# Patient Record
Sex: Female | Born: 1940 | Race: White | Hispanic: No | Marital: Married | Smoking: Never smoker
Health system: Southern US, Community
[De-identification: ages and names within clinical notes are randomized; demographics above are authoritative.]

## PROBLEM LIST (undated history)

## (undated) DIAGNOSIS — J45909 Unspecified asthma, uncomplicated: Secondary | ICD-10-CM

## (undated) DIAGNOSIS — K219 Gastro-esophageal reflux disease without esophagitis: Secondary | ICD-10-CM

## (undated) DIAGNOSIS — E039 Hypothyroidism, unspecified: Secondary | ICD-10-CM

## (undated) DIAGNOSIS — I509 Heart failure, unspecified: Secondary | ICD-10-CM

## (undated) DIAGNOSIS — J449 Chronic obstructive pulmonary disease, unspecified: Secondary | ICD-10-CM

## (undated) DIAGNOSIS — I10 Essential (primary) hypertension: Secondary | ICD-10-CM

## (undated) HISTORY — PX: JOINT REPLACEMENT: SHX530

## (undated) HISTORY — PX: ABDOMINAL HYSTERECTOMY: SHX81

---

## 2017-04-24 DIAGNOSIS — E7800 Pure hypercholesterolemia, unspecified: Secondary | ICD-10-CM | POA: Diagnosis not present

## 2017-04-24 DIAGNOSIS — E039 Hypothyroidism, unspecified: Secondary | ICD-10-CM | POA: Diagnosis not present

## 2017-04-24 DIAGNOSIS — I10 Essential (primary) hypertension: Secondary | ICD-10-CM | POA: Diagnosis not present

## 2017-05-08 DIAGNOSIS — E7800 Pure hypercholesterolemia, unspecified: Secondary | ICD-10-CM | POA: Diagnosis not present

## 2017-05-08 DIAGNOSIS — F329 Major depressive disorder, single episode, unspecified: Secondary | ICD-10-CM | POA: Diagnosis not present

## 2017-05-08 DIAGNOSIS — F411 Generalized anxiety disorder: Secondary | ICD-10-CM | POA: Diagnosis not present

## 2017-05-08 DIAGNOSIS — I10 Essential (primary) hypertension: Secondary | ICD-10-CM | POA: Diagnosis not present

## 2017-06-26 DIAGNOSIS — J302 Other seasonal allergic rhinitis: Secondary | ICD-10-CM | POA: Diagnosis not present

## 2017-06-26 DIAGNOSIS — J40 Bronchitis, not specified as acute or chronic: Secondary | ICD-10-CM | POA: Diagnosis not present

## 2017-06-26 DIAGNOSIS — R0602 Shortness of breath: Secondary | ICD-10-CM | POA: Diagnosis not present

## 2017-06-26 DIAGNOSIS — M159 Polyosteoarthritis, unspecified: Secondary | ICD-10-CM | POA: Diagnosis not present

## 2017-08-14 DIAGNOSIS — I10 Essential (primary) hypertension: Secondary | ICD-10-CM | POA: Diagnosis not present

## 2017-08-14 DIAGNOSIS — E039 Hypothyroidism, unspecified: Secondary | ICD-10-CM | POA: Diagnosis not present

## 2017-08-14 DIAGNOSIS — E7800 Pure hypercholesterolemia, unspecified: Secondary | ICD-10-CM | POA: Diagnosis not present

## 2017-08-21 DIAGNOSIS — E669 Obesity, unspecified: Secondary | ICD-10-CM | POA: Diagnosis not present

## 2017-08-21 DIAGNOSIS — E039 Hypothyroidism, unspecified: Secondary | ICD-10-CM | POA: Diagnosis not present

## 2017-08-21 DIAGNOSIS — E7800 Pure hypercholesterolemia, unspecified: Secondary | ICD-10-CM | POA: Diagnosis not present

## 2017-08-21 DIAGNOSIS — Z6831 Body mass index (BMI) 31.0-31.9, adult: Secondary | ICD-10-CM | POA: Diagnosis not present

## 2017-08-21 DIAGNOSIS — I10 Essential (primary) hypertension: Secondary | ICD-10-CM | POA: Diagnosis not present

## 2017-08-21 DIAGNOSIS — K219 Gastro-esophageal reflux disease without esophagitis: Secondary | ICD-10-CM | POA: Diagnosis not present

## 2017-08-21 DIAGNOSIS — Z0000 Encounter for general adult medical examination without abnormal findings: Secondary | ICD-10-CM | POA: Diagnosis not present

## 2017-11-20 DIAGNOSIS — E039 Hypothyroidism, unspecified: Secondary | ICD-10-CM | POA: Diagnosis not present

## 2017-11-20 DIAGNOSIS — I10 Essential (primary) hypertension: Secondary | ICD-10-CM | POA: Diagnosis not present

## 2017-11-20 DIAGNOSIS — E7800 Pure hypercholesterolemia, unspecified: Secondary | ICD-10-CM | POA: Diagnosis not present

## 2017-11-27 DIAGNOSIS — K219 Gastro-esophageal reflux disease without esophagitis: Secondary | ICD-10-CM | POA: Diagnosis not present

## 2017-11-27 DIAGNOSIS — K580 Irritable bowel syndrome with diarrhea: Secondary | ICD-10-CM | POA: Diagnosis not present

## 2017-11-27 DIAGNOSIS — E669 Obesity, unspecified: Secondary | ICD-10-CM | POA: Diagnosis not present

## 2017-11-27 DIAGNOSIS — E7800 Pure hypercholesterolemia, unspecified: Secondary | ICD-10-CM | POA: Diagnosis not present

## 2017-11-27 DIAGNOSIS — F329 Major depressive disorder, single episode, unspecified: Secondary | ICD-10-CM | POA: Diagnosis not present

## 2017-11-27 DIAGNOSIS — F5104 Psychophysiologic insomnia: Secondary | ICD-10-CM | POA: Diagnosis not present

## 2017-11-27 DIAGNOSIS — G2581 Restless legs syndrome: Secondary | ICD-10-CM | POA: Diagnosis not present

## 2017-11-27 DIAGNOSIS — M159 Polyosteoarthritis, unspecified: Secondary | ICD-10-CM | POA: Diagnosis not present

## 2017-11-27 DIAGNOSIS — E039 Hypothyroidism, unspecified: Secondary | ICD-10-CM | POA: Diagnosis not present

## 2017-11-27 DIAGNOSIS — I10 Essential (primary) hypertension: Secondary | ICD-10-CM | POA: Diagnosis not present

## 2017-11-27 DIAGNOSIS — F411 Generalized anxiety disorder: Secondary | ICD-10-CM | POA: Diagnosis not present

## 2017-11-27 DIAGNOSIS — J302 Other seasonal allergic rhinitis: Secondary | ICD-10-CM | POA: Diagnosis not present

## 2018-01-05 DIAGNOSIS — Z23 Encounter for immunization: Secondary | ICD-10-CM | POA: Diagnosis not present

## 2018-03-05 DIAGNOSIS — E7800 Pure hypercholesterolemia, unspecified: Secondary | ICD-10-CM | POA: Diagnosis not present

## 2018-03-05 DIAGNOSIS — I10 Essential (primary) hypertension: Secondary | ICD-10-CM | POA: Diagnosis not present

## 2018-03-05 DIAGNOSIS — E039 Hypothyroidism, unspecified: Secondary | ICD-10-CM | POA: Diagnosis not present

## 2018-03-10 DIAGNOSIS — J302 Other seasonal allergic rhinitis: Secondary | ICD-10-CM | POA: Diagnosis not present

## 2018-03-10 DIAGNOSIS — G2581 Restless legs syndrome: Secondary | ICD-10-CM | POA: Diagnosis not present

## 2018-03-10 DIAGNOSIS — K580 Irritable bowel syndrome with diarrhea: Secondary | ICD-10-CM | POA: Diagnosis not present

## 2018-03-10 DIAGNOSIS — E039 Hypothyroidism, unspecified: Secondary | ICD-10-CM | POA: Diagnosis not present

## 2018-03-10 DIAGNOSIS — Z1321 Encounter for screening for nutritional disorder: Secondary | ICD-10-CM | POA: Diagnosis not present

## 2018-03-10 DIAGNOSIS — K219 Gastro-esophageal reflux disease without esophagitis: Secondary | ICD-10-CM | POA: Diagnosis not present

## 2018-03-10 DIAGNOSIS — F411 Generalized anxiety disorder: Secondary | ICD-10-CM | POA: Diagnosis not present

## 2018-03-10 DIAGNOSIS — M159 Polyosteoarthritis, unspecified: Secondary | ICD-10-CM | POA: Diagnosis not present

## 2018-03-10 DIAGNOSIS — E7800 Pure hypercholesterolemia, unspecified: Secondary | ICD-10-CM | POA: Diagnosis not present

## 2018-03-10 DIAGNOSIS — I10 Essential (primary) hypertension: Secondary | ICD-10-CM | POA: Diagnosis not present

## 2018-03-10 DIAGNOSIS — Z1211 Encounter for screening for malignant neoplasm of colon: Secondary | ICD-10-CM | POA: Diagnosis not present

## 2018-03-10 DIAGNOSIS — Z0000 Encounter for general adult medical examination without abnormal findings: Secondary | ICD-10-CM | POA: Diagnosis not present

## 2018-04-29 DIAGNOSIS — K639 Disease of intestine, unspecified: Secondary | ICD-10-CM | POA: Diagnosis not present

## 2018-04-29 DIAGNOSIS — R197 Diarrhea, unspecified: Secondary | ICD-10-CM | POA: Diagnosis not present

## 2018-04-29 DIAGNOSIS — Z1211 Encounter for screening for malignant neoplasm of colon: Secondary | ICD-10-CM | POA: Diagnosis not present

## 2018-04-29 DIAGNOSIS — K648 Other hemorrhoids: Secondary | ICD-10-CM | POA: Diagnosis not present

## 2018-04-29 DIAGNOSIS — Z86010 Personal history of colonic polyps: Secondary | ICD-10-CM | POA: Diagnosis not present

## 2018-05-30 DIAGNOSIS — J019 Acute sinusitis, unspecified: Secondary | ICD-10-CM | POA: Diagnosis not present

## 2018-06-04 DIAGNOSIS — E039 Hypothyroidism, unspecified: Secondary | ICD-10-CM | POA: Diagnosis not present

## 2018-06-04 DIAGNOSIS — I10 Essential (primary) hypertension: Secondary | ICD-10-CM | POA: Diagnosis not present

## 2018-06-04 DIAGNOSIS — Z1321 Encounter for screening for nutritional disorder: Secondary | ICD-10-CM | POA: Diagnosis not present

## 2018-06-04 DIAGNOSIS — E7800 Pure hypercholesterolemia, unspecified: Secondary | ICD-10-CM | POA: Diagnosis not present

## 2018-06-11 DIAGNOSIS — I7300 Raynaud's syndrome without gangrene: Secondary | ICD-10-CM | POA: Diagnosis not present

## 2018-06-11 DIAGNOSIS — I10 Essential (primary) hypertension: Secondary | ICD-10-CM | POA: Diagnosis not present

## 2018-06-11 DIAGNOSIS — K219 Gastro-esophageal reflux disease without esophagitis: Secondary | ICD-10-CM | POA: Diagnosis not present

## 2018-06-11 DIAGNOSIS — G2581 Restless legs syndrome: Secondary | ICD-10-CM | POA: Diagnosis not present

## 2018-06-11 DIAGNOSIS — M159 Polyosteoarthritis, unspecified: Secondary | ICD-10-CM | POA: Diagnosis not present

## 2018-06-11 DIAGNOSIS — J302 Other seasonal allergic rhinitis: Secondary | ICD-10-CM | POA: Diagnosis not present

## 2018-06-11 DIAGNOSIS — E039 Hypothyroidism, unspecified: Secondary | ICD-10-CM | POA: Diagnosis not present

## 2018-06-11 DIAGNOSIS — E7800 Pure hypercholesterolemia, unspecified: Secondary | ICD-10-CM | POA: Diagnosis not present

## 2018-06-11 DIAGNOSIS — F5104 Psychophysiologic insomnia: Secondary | ICD-10-CM | POA: Diagnosis not present

## 2018-06-11 DIAGNOSIS — F411 Generalized anxiety disorder: Secondary | ICD-10-CM | POA: Diagnosis not present

## 2018-06-11 DIAGNOSIS — F329 Major depressive disorder, single episode, unspecified: Secondary | ICD-10-CM | POA: Diagnosis not present

## 2018-06-11 DIAGNOSIS — E669 Obesity, unspecified: Secondary | ICD-10-CM | POA: Diagnosis not present

## 2018-07-16 DIAGNOSIS — J302 Other seasonal allergic rhinitis: Secondary | ICD-10-CM | POA: Diagnosis not present

## 2018-08-23 DIAGNOSIS — L821 Other seborrheic keratosis: Secondary | ICD-10-CM | POA: Diagnosis not present

## 2018-08-23 DIAGNOSIS — L570 Actinic keratosis: Secondary | ICD-10-CM | POA: Diagnosis not present

## 2018-09-10 DIAGNOSIS — E7800 Pure hypercholesterolemia, unspecified: Secondary | ICD-10-CM | POA: Diagnosis not present

## 2018-09-10 DIAGNOSIS — I10 Essential (primary) hypertension: Secondary | ICD-10-CM | POA: Diagnosis not present

## 2018-09-10 DIAGNOSIS — E559 Vitamin D deficiency, unspecified: Secondary | ICD-10-CM | POA: Diagnosis not present

## 2018-09-17 DIAGNOSIS — Z1159 Encounter for screening for other viral diseases: Secondary | ICD-10-CM | POA: Diagnosis not present

## 2018-09-17 DIAGNOSIS — M159 Polyosteoarthritis, unspecified: Secondary | ICD-10-CM | POA: Diagnosis not present

## 2018-09-17 DIAGNOSIS — G2581 Restless legs syndrome: Secondary | ICD-10-CM | POA: Diagnosis not present

## 2018-09-17 DIAGNOSIS — K580 Irritable bowel syndrome with diarrhea: Secondary | ICD-10-CM | POA: Diagnosis not present

## 2018-09-17 DIAGNOSIS — E7800 Pure hypercholesterolemia, unspecified: Secondary | ICD-10-CM | POA: Diagnosis not present

## 2018-09-17 DIAGNOSIS — E039 Hypothyroidism, unspecified: Secondary | ICD-10-CM | POA: Diagnosis not present

## 2018-09-17 DIAGNOSIS — K219 Gastro-esophageal reflux disease without esophagitis: Secondary | ICD-10-CM | POA: Diagnosis not present

## 2018-09-17 DIAGNOSIS — I7300 Raynaud's syndrome without gangrene: Secondary | ICD-10-CM | POA: Diagnosis not present

## 2018-09-17 DIAGNOSIS — F5104 Psychophysiologic insomnia: Secondary | ICD-10-CM | POA: Diagnosis not present

## 2018-09-17 DIAGNOSIS — F411 Generalized anxiety disorder: Secondary | ICD-10-CM | POA: Diagnosis not present

## 2018-09-17 DIAGNOSIS — E669 Obesity, unspecified: Secondary | ICD-10-CM | POA: Diagnosis not present

## 2018-09-17 DIAGNOSIS — F329 Major depressive disorder, single episode, unspecified: Secondary | ICD-10-CM | POA: Diagnosis not present

## 2018-09-17 DIAGNOSIS — Z0000 Encounter for general adult medical examination without abnormal findings: Secondary | ICD-10-CM | POA: Diagnosis not present

## 2018-09-17 DIAGNOSIS — I10 Essential (primary) hypertension: Secondary | ICD-10-CM | POA: Diagnosis not present

## 2018-10-29 DIAGNOSIS — R05 Cough: Secondary | ICD-10-CM | POA: Diagnosis not present

## 2018-11-13 DIAGNOSIS — Z20828 Contact with and (suspected) exposure to other viral communicable diseases: Secondary | ICD-10-CM | POA: Diagnosis not present

## 2018-11-22 DIAGNOSIS — I8311 Varicose veins of right lower extremity with inflammation: Secondary | ICD-10-CM | POA: Diagnosis not present

## 2018-11-22 DIAGNOSIS — I8001 Phlebitis and thrombophlebitis of superficial vessels of right lower extremity: Secondary | ICD-10-CM | POA: Diagnosis not present

## 2018-11-22 DIAGNOSIS — I8312 Varicose veins of left lower extremity with inflammation: Secondary | ICD-10-CM | POA: Diagnosis not present

## 2018-11-24 DIAGNOSIS — I8312 Varicose veins of left lower extremity with inflammation: Secondary | ICD-10-CM | POA: Diagnosis not present

## 2018-11-24 DIAGNOSIS — I8311 Varicose veins of right lower extremity with inflammation: Secondary | ICD-10-CM | POA: Diagnosis not present

## 2018-11-28 DIAGNOSIS — R05 Cough: Secondary | ICD-10-CM | POA: Diagnosis not present

## 2018-11-28 DIAGNOSIS — U071 COVID-19: Secondary | ICD-10-CM | POA: Diagnosis not present

## 2018-11-28 DIAGNOSIS — R0602 Shortness of breath: Secondary | ICD-10-CM | POA: Diagnosis not present

## 2018-11-29 DIAGNOSIS — I83813 Varicose veins of bilateral lower extremities with pain: Secondary | ICD-10-CM | POA: Diagnosis not present

## 2018-11-29 DIAGNOSIS — R0602 Shortness of breath: Secondary | ICD-10-CM | POA: Diagnosis not present

## 2018-11-29 DIAGNOSIS — I8001 Phlebitis and thrombophlebitis of superficial vessels of right lower extremity: Secondary | ICD-10-CM | POA: Diagnosis not present

## 2018-11-29 DIAGNOSIS — I8312 Varicose veins of left lower extremity with inflammation: Secondary | ICD-10-CM | POA: Diagnosis not present

## 2018-11-29 DIAGNOSIS — R51 Headache: Secondary | ICD-10-CM | POA: Diagnosis not present

## 2018-11-29 DIAGNOSIS — Z20828 Contact with and (suspected) exposure to other viral communicable diseases: Secondary | ICD-10-CM | POA: Diagnosis not present

## 2018-11-29 DIAGNOSIS — I8311 Varicose veins of right lower extremity with inflammation: Secondary | ICD-10-CM | POA: Diagnosis not present

## 2018-12-08 DIAGNOSIS — R062 Wheezing: Secondary | ICD-10-CM | POA: Diagnosis not present

## 2018-12-08 DIAGNOSIS — R05 Cough: Secondary | ICD-10-CM | POA: Diagnosis not present

## 2018-12-08 DIAGNOSIS — R0602 Shortness of breath: Secondary | ICD-10-CM | POA: Diagnosis not present

## 2018-12-08 DIAGNOSIS — J209 Acute bronchitis, unspecified: Secondary | ICD-10-CM | POA: Diagnosis not present

## 2018-12-09 DIAGNOSIS — R062 Wheezing: Secondary | ICD-10-CM | POA: Diagnosis not present

## 2018-12-09 DIAGNOSIS — J209 Acute bronchitis, unspecified: Secondary | ICD-10-CM | POA: Diagnosis not present

## 2018-12-10 DIAGNOSIS — E669 Obesity, unspecified: Secondary | ICD-10-CM | POA: Diagnosis not present

## 2018-12-10 DIAGNOSIS — I429 Cardiomyopathy, unspecified: Secondary | ICD-10-CM | POA: Diagnosis not present

## 2018-12-10 DIAGNOSIS — E7800 Pure hypercholesterolemia, unspecified: Secondary | ICD-10-CM | POA: Diagnosis not present

## 2018-12-10 DIAGNOSIS — I080 Rheumatic disorders of both mitral and aortic valves: Secondary | ICD-10-CM | POA: Diagnosis not present

## 2018-12-10 DIAGNOSIS — F418 Other specified anxiety disorders: Secondary | ICD-10-CM | POA: Diagnosis not present

## 2018-12-10 DIAGNOSIS — I498 Other specified cardiac arrhythmias: Secondary | ICD-10-CM | POA: Diagnosis not present

## 2018-12-10 DIAGNOSIS — G2581 Restless legs syndrome: Secondary | ICD-10-CM | POA: Diagnosis not present

## 2018-12-10 DIAGNOSIS — U071 COVID-19: Secondary | ICD-10-CM | POA: Diagnosis not present

## 2018-12-10 DIAGNOSIS — K580 Irritable bowel syndrome with diarrhea: Secondary | ICD-10-CM | POA: Diagnosis not present

## 2018-12-10 DIAGNOSIS — T17590A Other foreign object in bronchus causing asphyxiation, initial encounter: Secondary | ICD-10-CM | POA: Diagnosis not present

## 2018-12-10 DIAGNOSIS — R402112 Coma scale, eyes open, never, at arrival to emergency department: Secondary | ICD-10-CM | POA: Diagnosis not present

## 2018-12-10 DIAGNOSIS — K828 Other specified diseases of gallbladder: Secondary | ICD-10-CM | POA: Diagnosis not present

## 2018-12-10 DIAGNOSIS — I083 Combined rheumatic disorders of mitral, aortic and tricuspid valves: Secondary | ICD-10-CM | POA: Diagnosis not present

## 2018-12-10 DIAGNOSIS — R402431 Glasgow coma scale score 3-8, in the field [EMT or ambulance]: Secondary | ICD-10-CM | POA: Diagnosis not present

## 2018-12-10 DIAGNOSIS — A419 Sepsis, unspecified organism: Secondary | ICD-10-CM | POA: Diagnosis not present

## 2018-12-10 DIAGNOSIS — I252 Old myocardial infarction: Secondary | ICD-10-CM | POA: Diagnosis not present

## 2018-12-10 DIAGNOSIS — Z781 Physical restraint status: Secondary | ICD-10-CM | POA: Diagnosis not present

## 2018-12-10 DIAGNOSIS — J9602 Acute respiratory failure with hypercapnia: Secondary | ICD-10-CM | POA: Diagnosis not present

## 2018-12-10 DIAGNOSIS — B3324 Viral cardiomyopathy: Secondary | ICD-10-CM | POA: Diagnosis not present

## 2018-12-10 DIAGNOSIS — Z452 Encounter for adjustment and management of vascular access device: Secondary | ICD-10-CM | POA: Diagnosis not present

## 2018-12-10 DIAGNOSIS — R402212 Coma scale, best verbal response, none, at arrival to emergency department: Secondary | ICD-10-CM | POA: Diagnosis not present

## 2018-12-10 DIAGNOSIS — R404 Transient alteration of awareness: Secondary | ICD-10-CM | POA: Diagnosis not present

## 2018-12-10 DIAGNOSIS — R55 Syncope and collapse: Secondary | ICD-10-CM | POA: Diagnosis not present

## 2018-12-10 DIAGNOSIS — R188 Other ascites: Secondary | ICD-10-CM | POA: Diagnosis not present

## 2018-12-10 DIAGNOSIS — F329 Major depressive disorder, single episode, unspecified: Secondary | ICD-10-CM | POA: Diagnosis not present

## 2018-12-10 DIAGNOSIS — Z6834 Body mass index (BMI) 34.0-34.9, adult: Secondary | ICD-10-CM | POA: Diagnosis not present

## 2018-12-10 DIAGNOSIS — N179 Acute kidney failure, unspecified: Secondary | ICD-10-CM | POA: Diagnosis not present

## 2018-12-10 DIAGNOSIS — I4510 Unspecified right bundle-branch block: Secondary | ICD-10-CM | POA: Diagnosis not present

## 2018-12-10 DIAGNOSIS — J80 Acute respiratory distress syndrome: Secondary | ICD-10-CM | POA: Diagnosis not present

## 2018-12-10 DIAGNOSIS — I428 Other cardiomyopathies: Secondary | ICD-10-CM | POA: Diagnosis not present

## 2018-12-10 DIAGNOSIS — J449 Chronic obstructive pulmonary disease, unspecified: Secondary | ICD-10-CM | POA: Diagnosis not present

## 2018-12-10 DIAGNOSIS — I700 Atherosclerosis of aorta: Secondary | ICD-10-CM | POA: Diagnosis not present

## 2018-12-10 DIAGNOSIS — E039 Hypothyroidism, unspecified: Secondary | ICD-10-CM | POA: Diagnosis not present

## 2018-12-10 DIAGNOSIS — K760 Fatty (change of) liver, not elsewhere classified: Secondary | ICD-10-CM | POA: Diagnosis not present

## 2018-12-10 DIAGNOSIS — K219 Gastro-esophageal reflux disease without esophagitis: Secondary | ICD-10-CM | POA: Diagnosis not present

## 2018-12-10 DIAGNOSIS — R402312 Coma scale, best motor response, none, at arrival to emergency department: Secondary | ICD-10-CM | POA: Diagnosis not present

## 2018-12-10 DIAGNOSIS — J441 Chronic obstructive pulmonary disease with (acute) exacerbation: Secondary | ICD-10-CM | POA: Diagnosis not present

## 2018-12-10 DIAGNOSIS — R000 Tachycardia, unspecified: Secondary | ICD-10-CM | POA: Diagnosis not present

## 2018-12-10 DIAGNOSIS — R6521 Severe sepsis with septic shock: Secondary | ICD-10-CM | POA: Diagnosis not present

## 2018-12-10 DIAGNOSIS — I110 Hypertensive heart disease with heart failure: Secondary | ICD-10-CM | POA: Diagnosis not present

## 2018-12-10 DIAGNOSIS — J9691 Respiratory failure, unspecified with hypoxia: Secondary | ICD-10-CM | POA: Diagnosis not present

## 2018-12-10 DIAGNOSIS — E872 Acidosis: Secondary | ICD-10-CM | POA: Diagnosis not present

## 2018-12-10 DIAGNOSIS — R069 Unspecified abnormalities of breathing: Secondary | ICD-10-CM | POA: Diagnosis not present

## 2018-12-10 DIAGNOSIS — R4020 Unspecified coma: Secondary | ICD-10-CM | POA: Diagnosis not present

## 2018-12-10 DIAGNOSIS — J9601 Acute respiratory failure with hypoxia: Secondary | ICD-10-CM | POA: Diagnosis not present

## 2018-12-10 DIAGNOSIS — J440 Chronic obstructive pulmonary disease with acute lower respiratory infection: Secondary | ICD-10-CM | POA: Diagnosis not present

## 2018-12-10 DIAGNOSIS — R0602 Shortness of breath: Secondary | ICD-10-CM | POA: Diagnosis not present

## 2018-12-10 DIAGNOSIS — A4189 Other specified sepsis: Secondary | ICD-10-CM | POA: Diagnosis not present

## 2018-12-10 DIAGNOSIS — J9600 Acute respiratory failure, unspecified whether with hypoxia or hypercapnia: Secondary | ICD-10-CM | POA: Diagnosis not present

## 2018-12-10 DIAGNOSIS — J1289 Other viral pneumonia: Secondary | ICD-10-CM | POA: Diagnosis not present

## 2018-12-10 DIAGNOSIS — R0902 Hypoxemia: Secondary | ICD-10-CM | POA: Diagnosis not present

## 2018-12-10 DIAGNOSIS — I5021 Acute systolic (congestive) heart failure: Secondary | ICD-10-CM | POA: Diagnosis not present

## 2018-12-10 DIAGNOSIS — I493 Ventricular premature depolarization: Secondary | ICD-10-CM | POA: Diagnosis not present

## 2018-12-10 DIAGNOSIS — R0603 Acute respiratory distress: Secondary | ICD-10-CM | POA: Diagnosis not present

## 2018-12-10 DIAGNOSIS — I214 Non-ST elevation (NSTEMI) myocardial infarction: Secondary | ICD-10-CM | POA: Diagnosis not present

## 2018-12-10 DIAGNOSIS — I10 Essential (primary) hypertension: Secondary | ICD-10-CM | POA: Diagnosis not present

## 2018-12-18 DIAGNOSIS — J9602 Acute respiratory failure with hypercapnia: Secondary | ICD-10-CM | POA: Diagnosis not present

## 2018-12-18 DIAGNOSIS — I5022 Chronic systolic (congestive) heart failure: Secondary | ICD-10-CM | POA: Diagnosis not present

## 2018-12-18 DIAGNOSIS — Z833 Family history of diabetes mellitus: Secondary | ICD-10-CM | POA: Diagnosis not present

## 2018-12-18 DIAGNOSIS — J9601 Acute respiratory failure with hypoxia: Secondary | ICD-10-CM | POA: Diagnosis not present

## 2018-12-18 DIAGNOSIS — G9341 Metabolic encephalopathy: Secondary | ICD-10-CM | POA: Diagnosis not present

## 2018-12-18 DIAGNOSIS — F418 Other specified anxiety disorders: Secondary | ICD-10-CM | POA: Diagnosis not present

## 2018-12-18 DIAGNOSIS — R9431 Abnormal electrocardiogram [ECG] [EKG]: Secondary | ICD-10-CM | POA: Diagnosis not present

## 2018-12-18 DIAGNOSIS — Z90710 Acquired absence of both cervix and uterus: Secondary | ICD-10-CM | POA: Diagnosis not present

## 2018-12-18 DIAGNOSIS — Z608 Other problems related to social environment: Secondary | ICD-10-CM | POA: Diagnosis not present

## 2018-12-18 DIAGNOSIS — K219 Gastro-esophageal reflux disease without esophagitis: Secondary | ICD-10-CM | POA: Diagnosis not present

## 2018-12-18 DIAGNOSIS — M1990 Unspecified osteoarthritis, unspecified site: Secondary | ICD-10-CM | POA: Diagnosis not present

## 2018-12-18 DIAGNOSIS — R069 Unspecified abnormalities of breathing: Secondary | ICD-10-CM | POA: Diagnosis not present

## 2018-12-18 DIAGNOSIS — J9621 Acute and chronic respiratory failure with hypoxia: Secondary | ICD-10-CM | POA: Diagnosis not present

## 2018-12-18 DIAGNOSIS — U071 COVID-19: Secondary | ICD-10-CM | POA: Diagnosis not present

## 2018-12-18 DIAGNOSIS — R0602 Shortness of breath: Secondary | ICD-10-CM | POA: Diagnosis not present

## 2018-12-18 DIAGNOSIS — T402X5A Adverse effect of other opioids, initial encounter: Secondary | ICD-10-CM | POA: Diagnosis not present

## 2018-12-18 DIAGNOSIS — K760 Fatty (change of) liver, not elsewhere classified: Secondary | ICD-10-CM | POA: Diagnosis not present

## 2018-12-18 DIAGNOSIS — R918 Other nonspecific abnormal finding of lung field: Secondary | ICD-10-CM | POA: Diagnosis not present

## 2018-12-18 DIAGNOSIS — R404 Transient alteration of awareness: Secondary | ICD-10-CM | POA: Diagnosis not present

## 2018-12-18 DIAGNOSIS — R0689 Other abnormalities of breathing: Secondary | ICD-10-CM | POA: Diagnosis not present

## 2018-12-18 DIAGNOSIS — E039 Hypothyroidism, unspecified: Secondary | ICD-10-CM | POA: Diagnosis not present

## 2018-12-18 DIAGNOSIS — J811 Chronic pulmonary edema: Secondary | ICD-10-CM | POA: Diagnosis not present

## 2018-12-18 DIAGNOSIS — J9692 Respiratory failure, unspecified with hypercapnia: Secondary | ICD-10-CM | POA: Diagnosis not present

## 2018-12-18 DIAGNOSIS — E785 Hyperlipidemia, unspecified: Secondary | ICD-10-CM | POA: Diagnosis not present

## 2018-12-18 DIAGNOSIS — K580 Irritable bowel syndrome with diarrhea: Secondary | ICD-10-CM | POA: Diagnosis not present

## 2018-12-18 DIAGNOSIS — K7581 Nonalcoholic steatohepatitis (NASH): Secondary | ICD-10-CM | POA: Diagnosis not present

## 2018-12-18 DIAGNOSIS — E860 Dehydration: Secondary | ICD-10-CM | POA: Diagnosis not present

## 2018-12-18 DIAGNOSIS — R0989 Other specified symptoms and signs involving the circulatory and respiratory systems: Secondary | ICD-10-CM | POA: Diagnosis not present

## 2018-12-18 DIAGNOSIS — Z9981 Dependence on supplemental oxygen: Secondary | ICD-10-CM | POA: Diagnosis not present

## 2018-12-18 DIAGNOSIS — I10 Essential (primary) hypertension: Secondary | ICD-10-CM | POA: Diagnosis not present

## 2018-12-18 DIAGNOSIS — R197 Diarrhea, unspecified: Secondary | ICD-10-CM | POA: Diagnosis not present

## 2018-12-18 DIAGNOSIS — G4730 Sleep apnea, unspecified: Secondary | ICD-10-CM | POA: Diagnosis not present

## 2018-12-18 DIAGNOSIS — J440 Chronic obstructive pulmonary disease with acute lower respiratory infection: Secondary | ICD-10-CM | POA: Diagnosis not present

## 2018-12-18 DIAGNOSIS — R0902 Hypoxemia: Secondary | ICD-10-CM | POA: Diagnosis not present

## 2018-12-18 DIAGNOSIS — Z825 Family history of asthma and other chronic lower respiratory diseases: Secondary | ICD-10-CM | POA: Diagnosis not present

## 2018-12-18 DIAGNOSIS — C7400 Malignant neoplasm of cortex of unspecified adrenal gland: Secondary | ICD-10-CM | POA: Diagnosis not present

## 2018-12-18 DIAGNOSIS — G4733 Obstructive sleep apnea (adult) (pediatric): Secondary | ICD-10-CM | POA: Diagnosis not present

## 2018-12-18 DIAGNOSIS — E669 Obesity, unspecified: Secondary | ICD-10-CM | POA: Diagnosis not present

## 2018-12-18 DIAGNOSIS — J1289 Other viral pneumonia: Secondary | ICD-10-CM | POA: Diagnosis not present

## 2018-12-18 DIAGNOSIS — J9622 Acute and chronic respiratory failure with hypercapnia: Secondary | ICD-10-CM | POA: Diagnosis not present

## 2018-12-18 DIAGNOSIS — G4700 Insomnia, unspecified: Secondary | ICD-10-CM | POA: Diagnosis not present

## 2018-12-18 DIAGNOSIS — Z79899 Other long term (current) drug therapy: Secondary | ICD-10-CM | POA: Diagnosis not present

## 2018-12-18 DIAGNOSIS — Z6837 Body mass index (BMI) 37.0-37.9, adult: Secondary | ICD-10-CM | POA: Diagnosis not present

## 2018-12-18 DIAGNOSIS — I110 Hypertensive heart disease with heart failure: Secondary | ICD-10-CM | POA: Diagnosis not present

## 2018-12-18 DIAGNOSIS — J449 Chronic obstructive pulmonary disease, unspecified: Secondary | ICD-10-CM | POA: Diagnosis not present

## 2018-12-27 DIAGNOSIS — U071 COVID-19: Secondary | ICD-10-CM | POA: Diagnosis not present

## 2018-12-27 DIAGNOSIS — R918 Other nonspecific abnormal finding of lung field: Secondary | ICD-10-CM | POA: Diagnosis not present

## 2018-12-27 DIAGNOSIS — J189 Pneumonia, unspecified organism: Secondary | ICD-10-CM | POA: Diagnosis not present

## 2018-12-27 DIAGNOSIS — Z23 Encounter for immunization: Secondary | ICD-10-CM | POA: Diagnosis not present

## 2018-12-27 DIAGNOSIS — G4733 Obstructive sleep apnea (adult) (pediatric): Secondary | ICD-10-CM | POA: Diagnosis not present

## 2018-12-27 DIAGNOSIS — J440 Chronic obstructive pulmonary disease with acute lower respiratory infection: Secondary | ICD-10-CM | POA: Diagnosis not present

## 2018-12-27 DIAGNOSIS — J9622 Acute and chronic respiratory failure with hypercapnia: Secondary | ICD-10-CM | POA: Diagnosis not present

## 2018-12-27 DIAGNOSIS — J9621 Acute and chronic respiratory failure with hypoxia: Secondary | ICD-10-CM | POA: Diagnosis not present

## 2018-12-28 DIAGNOSIS — E7800 Pure hypercholesterolemia, unspecified: Secondary | ICD-10-CM | POA: Diagnosis not present

## 2018-12-28 DIAGNOSIS — I429 Cardiomyopathy, unspecified: Secondary | ICD-10-CM | POA: Diagnosis not present

## 2018-12-28 DIAGNOSIS — I10 Essential (primary) hypertension: Secondary | ICD-10-CM | POA: Diagnosis not present

## 2018-12-28 DIAGNOSIS — I214 Non-ST elevation (NSTEMI) myocardial infarction: Secondary | ICD-10-CM | POA: Diagnosis not present

## 2018-12-29 DIAGNOSIS — R918 Other nonspecific abnormal finding of lung field: Secondary | ICD-10-CM | POA: Diagnosis not present

## 2018-12-29 DIAGNOSIS — J440 Chronic obstructive pulmonary disease with acute lower respiratory infection: Secondary | ICD-10-CM | POA: Diagnosis not present

## 2018-12-29 DIAGNOSIS — J189 Pneumonia, unspecified organism: Secondary | ICD-10-CM | POA: Diagnosis not present

## 2018-12-29 DIAGNOSIS — U071 COVID-19: Secondary | ICD-10-CM | POA: Diagnosis not present

## 2018-12-29 DIAGNOSIS — J9622 Acute and chronic respiratory failure with hypercapnia: Secondary | ICD-10-CM | POA: Diagnosis not present

## 2018-12-29 DIAGNOSIS — J9621 Acute and chronic respiratory failure with hypoxia: Secondary | ICD-10-CM | POA: Diagnosis not present

## 2018-12-29 DIAGNOSIS — Z23 Encounter for immunization: Secondary | ICD-10-CM | POA: Diagnosis not present

## 2018-12-29 DIAGNOSIS — G4733 Obstructive sleep apnea (adult) (pediatric): Secondary | ICD-10-CM | POA: Diagnosis not present

## 2019-01-08 ENCOUNTER — Inpatient Hospital Stay (HOSPITAL_COMMUNITY)
Admission: EM | Admit: 2019-01-08 | Discharge: 2019-01-10 | DRG: 177 | Disposition: A | Discharge status: Home / Self Care | Payer: PPO | Attending: Internal Medicine | Admitting: Internal Medicine

## 2019-01-08 ENCOUNTER — Emergency Department (HOSPITAL_COMMUNITY): Payer: PPO

## 2019-01-08 ENCOUNTER — Encounter (HOSPITAL_COMMUNITY): Payer: Self-pay | Admitting: Emergency Medicine

## 2019-01-08 ENCOUNTER — Other Ambulatory Visit: Payer: Self-pay

## 2019-01-08 DIAGNOSIS — Z79890 Hormone replacement therapy (postmenopausal): Secondary | ICD-10-CM | POA: Diagnosis not present

## 2019-01-08 DIAGNOSIS — L89891 Pressure ulcer of other site, stage 1: Secondary | ICD-10-CM | POA: Clinically undetermined

## 2019-01-08 DIAGNOSIS — Z96653 Presence of artificial knee joint, bilateral: Secondary | ICD-10-CM | POA: Diagnosis present

## 2019-01-08 DIAGNOSIS — R05 Cough: Secondary | ICD-10-CM | POA: Diagnosis not present

## 2019-01-08 DIAGNOSIS — I5022 Chronic systolic (congestive) heart failure: Secondary | ICD-10-CM | POA: Diagnosis not present

## 2019-01-08 DIAGNOSIS — E669 Obesity, unspecified: Secondary | ICD-10-CM | POA: Diagnosis present

## 2019-01-08 DIAGNOSIS — I700 Atherosclerosis of aorta: Secondary | ICD-10-CM | POA: Diagnosis present

## 2019-01-08 DIAGNOSIS — I2510 Atherosclerotic heart disease of native coronary artery without angina pectoris: Secondary | ICD-10-CM | POA: Diagnosis not present

## 2019-01-08 DIAGNOSIS — U071 COVID-19: Secondary | ICD-10-CM | POA: Diagnosis not present

## 2019-01-08 DIAGNOSIS — E039 Hypothyroidism, unspecified: Secondary | ICD-10-CM | POA: Diagnosis not present

## 2019-01-08 DIAGNOSIS — J441 Chronic obstructive pulmonary disease with (acute) exacerbation: Secondary | ICD-10-CM | POA: Diagnosis present

## 2019-01-08 DIAGNOSIS — G4733 Obstructive sleep apnea (adult) (pediatric): Secondary | ICD-10-CM | POA: Diagnosis not present

## 2019-01-08 DIAGNOSIS — Z79891 Long term (current) use of opiate analgesic: Secondary | ICD-10-CM

## 2019-01-08 DIAGNOSIS — N179 Acute kidney failure, unspecified: Secondary | ICD-10-CM | POA: Diagnosis not present

## 2019-01-08 DIAGNOSIS — F419 Anxiety disorder, unspecified: Secondary | ICD-10-CM | POA: Diagnosis not present

## 2019-01-08 DIAGNOSIS — Z6832 Body mass index (BMI) 32.0-32.9, adult: Secondary | ICD-10-CM

## 2019-01-08 DIAGNOSIS — D649 Anemia, unspecified: Secondary | ICD-10-CM | POA: Diagnosis present

## 2019-01-08 DIAGNOSIS — E785 Hyperlipidemia, unspecified: Secondary | ICD-10-CM | POA: Diagnosis present

## 2019-01-08 DIAGNOSIS — R918 Other nonspecific abnormal finding of lung field: Secondary | ICD-10-CM | POA: Diagnosis not present

## 2019-01-08 DIAGNOSIS — Z8619 Personal history of other infectious and parasitic diseases: Secondary | ICD-10-CM | POA: Diagnosis not present

## 2019-01-08 DIAGNOSIS — I110 Hypertensive heart disease with heart failure: Secondary | ICD-10-CM | POA: Diagnosis present

## 2019-01-08 DIAGNOSIS — Z9981 Dependence on supplemental oxygen: Secondary | ICD-10-CM

## 2019-01-08 DIAGNOSIS — J9601 Acute respiratory failure with hypoxia: Secondary | ICD-10-CM | POA: Diagnosis not present

## 2019-01-08 DIAGNOSIS — R0602 Shortness of breath: Secondary | ICD-10-CM | POA: Diagnosis not present

## 2019-01-08 DIAGNOSIS — I10 Essential (primary) hypertension: Secondary | ICD-10-CM | POA: Diagnosis present

## 2019-01-08 DIAGNOSIS — L8990 Pressure ulcer of unspecified site, unspecified stage: Secondary | ICD-10-CM | POA: Insufficient documentation

## 2019-01-08 DIAGNOSIS — Z79899 Other long term (current) drug therapy: Secondary | ICD-10-CM

## 2019-01-08 DIAGNOSIS — J9801 Acute bronchospasm: Secondary | ICD-10-CM | POA: Diagnosis not present

## 2019-01-08 HISTORY — DX: Heart failure, unspecified: I50.9

## 2019-01-08 HISTORY — DX: Chronic obstructive pulmonary disease, unspecified: J44.9

## 2019-01-08 HISTORY — DX: Hypothyroidism, unspecified: E03.9

## 2019-01-08 HISTORY — DX: Essential (primary) hypertension: I10

## 2019-01-08 HISTORY — DX: Gastro-esophageal reflux disease without esophagitis: K21.9

## 2019-01-08 HISTORY — DX: Unspecified asthma, uncomplicated: J45.909

## 2019-01-08 LAB — CBC WITH DIFFERENTIAL/PLATELET
Abs Immature Granulocytes: 0.02 10*3/uL (ref 0.00–0.07)
Basophils Absolute: 0 10*3/uL (ref 0.0–0.1)
Basophils Relative: 0 %
Eosinophils Absolute: 0.4 10*3/uL (ref 0.0–0.5)
Eosinophils Relative: 5 %
HCT: 36.8 % (ref 36.0–46.0)
Hemoglobin: 11.7 g/dL — ABNORMAL LOW (ref 12.0–15.0)
Immature Granulocytes: 0 %
Lymphocytes Relative: 22 %
Lymphs Abs: 1.5 10*3/uL (ref 0.7–4.0)
MCH: 30.8 pg (ref 26.0–34.0)
MCHC: 31.8 g/dL (ref 30.0–36.0)
MCV: 96.8 fL (ref 80.0–100.0)
Monocytes Absolute: 0.6 10*3/uL (ref 0.1–1.0)
Monocytes Relative: 9 %
Neutro Abs: 4.5 10*3/uL (ref 1.7–7.7)
Neutrophils Relative %: 64 %
Platelets: 246 10*3/uL (ref 150–400)
RBC: 3.8 MIL/uL — ABNORMAL LOW (ref 3.87–5.11)
RDW: 13.7 % (ref 11.5–15.5)
WBC: 7 10*3/uL (ref 4.0–10.5)
nRBC: 0 % (ref 0.0–0.2)

## 2019-01-08 LAB — COMPREHENSIVE METABOLIC PANEL
ALT: 11 U/L (ref 0–44)
AST: 17 U/L (ref 15–41)
Albumin: 3.5 g/dL (ref 3.5–5.0)
Alkaline Phosphatase: 46 U/L (ref 38–126)
Anion gap: 14 (ref 5–15)
BUN: 18 mg/dL (ref 8–23)
CO2: 25 mmol/L (ref 22–32)
Calcium: 9.2 mg/dL (ref 8.9–10.3)
Chloride: 99 mmol/L (ref 98–111)
Creatinine, Ser: 1.17 mg/dL — ABNORMAL HIGH (ref 0.44–1.00)
GFR calc Af Amer: 52 mL/min — ABNORMAL LOW (ref >60)
GFR calc non Af Amer: 45 mL/min — ABNORMAL LOW (ref >60)
Glucose, Bld: 122 mg/dL — ABNORMAL HIGH (ref 70–99)
Potassium: 3.7 mmol/L (ref 3.5–5.1)
Sodium: 138 mmol/L (ref 135–145)
Total Bilirubin: 0.7 mg/dL (ref 0.3–1.2)
Total Protein: 6.6 g/dL (ref 6.5–8.1)

## 2019-01-08 NOTE — ED Triage Notes (Signed)
Patient with shortness of breath, coughing and wheezing.  Patient states that she was released on Friday with the same.  She states that it has been getting worse.

## 2019-01-09 ENCOUNTER — Encounter (HOSPITAL_COMMUNITY): Payer: Self-pay | Admitting: Internal Medicine

## 2019-01-09 ENCOUNTER — Other Ambulatory Visit: Payer: Self-pay

## 2019-01-09 DIAGNOSIS — J441 Chronic obstructive pulmonary disease with (acute) exacerbation: Secondary | ICD-10-CM

## 2019-01-09 DIAGNOSIS — R918 Other nonspecific abnormal finding of lung field: Secondary | ICD-10-CM | POA: Diagnosis present

## 2019-01-09 DIAGNOSIS — Z96653 Presence of artificial knee joint, bilateral: Secondary | ICD-10-CM | POA: Diagnosis present

## 2019-01-09 DIAGNOSIS — G4733 Obstructive sleep apnea (adult) (pediatric): Secondary | ICD-10-CM | POA: Diagnosis present

## 2019-01-09 DIAGNOSIS — U071 COVID-19: Secondary | ICD-10-CM | POA: Diagnosis present

## 2019-01-09 DIAGNOSIS — Z6832 Body mass index (BMI) 32.0-32.9, adult: Secondary | ICD-10-CM | POA: Diagnosis not present

## 2019-01-09 DIAGNOSIS — J9801 Acute bronchospasm: Secondary | ICD-10-CM | POA: Diagnosis present

## 2019-01-09 DIAGNOSIS — L8990 Pressure ulcer of unspecified site, unspecified stage: Secondary | ICD-10-CM | POA: Insufficient documentation

## 2019-01-09 DIAGNOSIS — I5022 Chronic systolic (congestive) heart failure: Secondary | ICD-10-CM

## 2019-01-09 DIAGNOSIS — E669 Obesity, unspecified: Secondary | ICD-10-CM | POA: Diagnosis not present

## 2019-01-09 DIAGNOSIS — Z8619 Personal history of other infectious and parasitic diseases: Secondary | ICD-10-CM | POA: Diagnosis not present

## 2019-01-09 DIAGNOSIS — I700 Atherosclerosis of aorta: Secondary | ICD-10-CM | POA: Diagnosis present

## 2019-01-09 DIAGNOSIS — J9601 Acute respiratory failure with hypoxia: Secondary | ICD-10-CM | POA: Diagnosis present

## 2019-01-09 DIAGNOSIS — D649 Anemia, unspecified: Secondary | ICD-10-CM | POA: Diagnosis present

## 2019-01-09 DIAGNOSIS — Z79899 Other long term (current) drug therapy: Secondary | ICD-10-CM | POA: Diagnosis not present

## 2019-01-09 DIAGNOSIS — I2510 Atherosclerotic heart disease of native coronary artery without angina pectoris: Secondary | ICD-10-CM | POA: Diagnosis not present

## 2019-01-09 DIAGNOSIS — E039 Hypothyroidism, unspecified: Secondary | ICD-10-CM | POA: Diagnosis present

## 2019-01-09 DIAGNOSIS — I110 Hypertensive heart disease with heart failure: Secondary | ICD-10-CM | POA: Diagnosis present

## 2019-01-09 DIAGNOSIS — F419 Anxiety disorder, unspecified: Secondary | ICD-10-CM | POA: Diagnosis not present

## 2019-01-09 DIAGNOSIS — L89891 Pressure ulcer of other site, stage 1: Secondary | ICD-10-CM | POA: Diagnosis not present

## 2019-01-09 DIAGNOSIS — I10 Essential (primary) hypertension: Secondary | ICD-10-CM

## 2019-01-09 DIAGNOSIS — N179 Acute kidney failure, unspecified: Secondary | ICD-10-CM | POA: Diagnosis not present

## 2019-01-09 DIAGNOSIS — Z79890 Hormone replacement therapy (postmenopausal): Secondary | ICD-10-CM | POA: Diagnosis not present

## 2019-01-09 DIAGNOSIS — Z79891 Long term (current) use of opiate analgesic: Secondary | ICD-10-CM | POA: Diagnosis not present

## 2019-01-09 DIAGNOSIS — E785 Hyperlipidemia, unspecified: Secondary | ICD-10-CM | POA: Diagnosis present

## 2019-01-09 DIAGNOSIS — Z9981 Dependence on supplemental oxygen: Secondary | ICD-10-CM | POA: Diagnosis not present

## 2019-01-09 LAB — PROCALCITONIN: Procalcitonin: <0.1 ng/mL

## 2019-01-09 LAB — URINALYSIS, ROUTINE W REFLEX MICROSCOPIC
Bilirubin Urine: NEGATIVE
Glucose, UA: NEGATIVE mg/dL
Hgb urine dipstick: NEGATIVE
Ketones, ur: NEGATIVE mg/dL
Nitrite: NEGATIVE
Protein, ur: NEGATIVE mg/dL
Specific Gravity, Urine: 1.008 (ref 1.005–1.030)
pH: 5 (ref 5.0–8.0)

## 2019-01-09 LAB — C-REACTIVE PROTEIN: CRP: <0.8 mg/dL (ref <1.0)

## 2019-01-09 LAB — LACTATE DEHYDROGENASE: LDH: 145 U/L (ref 98–192)

## 2019-01-09 LAB — FIBRINOGEN: Fibrinogen: 511 mg/dL — ABNORMAL HIGH (ref 210–475)

## 2019-01-09 LAB — SODIUM, URINE, RANDOM: Sodium, Ur: 42 mmol/L

## 2019-01-09 LAB — ABO/RH: ABO/RH(D): O POS

## 2019-01-09 LAB — FERRITIN: Ferritin: 249 ng/mL (ref 11–307)

## 2019-01-09 LAB — TROPONIN I (HIGH SENSITIVITY)
Troponin I (High Sensitivity): 9 ng/L (ref <18)
Troponin I (High Sensitivity): 8 ng/L (ref <18)

## 2019-01-09 LAB — TYPE AND SCREEN
ABO/RH(D): O POS
Antibody Screen: NEGATIVE

## 2019-01-09 LAB — CREATININE, URINE, RANDOM: Creatinine, Urine: 55.28 mg/dL

## 2019-01-09 LAB — BRAIN NATRIURETIC PEPTIDE: B Natriuretic Peptide: 44.1 pg/mL (ref 0.0–100.0)

## 2019-01-09 LAB — SARS CORONAVIRUS 2 BY RT PCR (HOSPITAL ORDER, PERFORMED IN ~~LOC~~ HOSPITAL LAB): SARS Coronavirus 2: POSITIVE — AB

## 2019-01-09 LAB — D-DIMER, QUANTITATIVE: D-Dimer, Quant: 7.53 ug/mL-FEU — ABNORMAL HIGH (ref 0.00–0.50)

## 2019-01-09 MED ORDER — MOMETASONE FURO-FORMOTEROL FUM 100-5 MCG/ACT IN AERO
2.0000 | INHALATION_SPRAY | Freq: Two times a day (BID) | RESPIRATORY_TRACT | Status: DC
  Administered 2019-01-09 – 2019-01-10 (×2): 2 via RESPIRATORY_TRACT
  Filled 2019-01-09: qty 8.8

## 2019-01-09 MED ORDER — CITALOPRAM HYDROBROMIDE 10 MG PO TABS
20.0000 mg | ORAL_TABLET | Freq: Every day | ORAL | Status: DC
  Administered 2019-01-09 – 2019-01-10 (×2): 20 mg via ORAL
  Filled 2019-01-09 (×2): qty 2

## 2019-01-09 MED ORDER — IPRATROPIUM-ALBUTEROL 20-100 MCG/ACT IN AERS
1.0000 | INHALATION_SPRAY | Freq: Four times a day (QID) | RESPIRATORY_TRACT | Status: DC
  Administered 2019-01-09: 1 via RESPIRATORY_TRACT
  Filled 2019-01-09: qty 4

## 2019-01-09 MED ORDER — ROPINIROLE HCL 0.5 MG PO TABS
0.5000 mg | ORAL_TABLET | Freq: Three times a day (TID) | ORAL | Status: DC
  Administered 2019-01-09 – 2019-01-10 (×5): 0.5 mg via ORAL
  Filled 2019-01-09 (×9): qty 1

## 2019-01-09 MED ORDER — ENOXAPARIN SODIUM 40 MG/0.4ML ~~LOC~~ SOLN
40.0000 mg | SUBCUTANEOUS | Status: DC
  Administered 2019-01-09 – 2019-01-10 (×2): 40 mg via SUBCUTANEOUS
  Filled 2019-01-09 (×2): qty 0.4

## 2019-01-09 MED ORDER — ZINC SULFATE 220 (50 ZN) MG PO CAPS
220.0000 mg | ORAL_CAPSULE | Freq: Every day | ORAL | Status: DC
  Administered 2019-01-09 – 2019-01-10 (×2): 220 mg via ORAL
  Filled 2019-01-09 (×2): qty 1

## 2019-01-09 MED ORDER — ALBUTEROL SULFATE HFA 108 (90 BASE) MCG/ACT IN AERS
8.0000 | INHALATION_SPRAY | Freq: Once | RESPIRATORY_TRACT | Status: AC
  Administered 2019-01-09: 04:00:00 8 via RESPIRATORY_TRACT
  Filled 2019-01-09: qty 6.7

## 2019-01-09 MED ORDER — MONTELUKAST SODIUM 10 MG PO TABS
10.0000 mg | ORAL_TABLET | Freq: Every day | ORAL | Status: DC
  Administered 2019-01-09: 21:00:00 10 mg via ORAL
  Filled 2019-01-09: qty 1

## 2019-01-09 MED ORDER — DICYCLOMINE HCL 10 MG PO CAPS
10.0000 mg | ORAL_CAPSULE | Freq: Four times a day (QID) | ORAL | Status: DC | PRN
  Administered 2019-01-09 – 2019-01-10 (×2): 10 mg via ORAL
  Filled 2019-01-09 (×4): qty 1

## 2019-01-09 MED ORDER — ENOXAPARIN SODIUM 40 MG/0.4ML ~~LOC~~ SOLN: 40.0000 mg | SUBCUTANEOUS | Status: DC

## 2019-01-09 MED ORDER — ALBUTEROL SULFATE HFA 108 (90 BASE) MCG/ACT IN AERS
2.0000 | INHALATION_SPRAY | Freq: Four times a day (QID) | RESPIRATORY_TRACT | Status: DC
  Administered 2019-01-09 – 2019-01-10 (×6): 2 via RESPIRATORY_TRACT
  Filled 2019-01-09: qty 6.7

## 2019-01-09 MED ORDER — ONDANSETRON HCL 4 MG/2ML IJ SOLN: 4.0000 mg | Freq: Four times a day (QID) | INTRAMUSCULAR | Status: DC | PRN

## 2019-01-09 MED ORDER — SODIUM CHLORIDE 0.9% FLUSH
3.0000 mL | Freq: Two times a day (BID) | INTRAVENOUS | Status: DC
  Administered 2019-01-09 – 2019-01-10 (×2): 3 mL via INTRAVENOUS

## 2019-01-09 MED ORDER — TRAZODONE HCL 50 MG PO TABS
50.0000 mg | ORAL_TABLET | Freq: Every day | ORAL | Status: DC
  Administered 2019-01-09: 50 mg via ORAL
  Filled 2019-01-09: qty 1

## 2019-01-09 MED ORDER — CLONAZEPAM 0.5 MG PO TABS
0.5000 mg | ORAL_TABLET | Freq: Three times a day (TID) | ORAL | Status: DC | PRN
  Administered 2019-01-10: 17:00:00 0.5 mg via ORAL
  Filled 2019-01-09: qty 1

## 2019-01-09 MED ORDER — GUAIFENESIN-DM 100-10 MG/5ML PO SYRP: 10.0000 mL | ORAL_SOLUTION | ORAL | Status: DC | PRN

## 2019-01-09 MED ORDER — HYDROCODONE-ACETAMINOPHEN 5-325 MG PO TABS
1.0000 | ORAL_TABLET | Freq: Three times a day (TID) | ORAL | Status: DC | PRN
  Administered 2019-01-09 – 2019-01-10 (×2): 1 via ORAL
  Filled 2019-01-09 (×3): qty 1

## 2019-01-09 MED ORDER — ACETAMINOPHEN 325 MG PO TABS: 650.0000 mg | ORAL_TABLET | Freq: Four times a day (QID) | ORAL | Status: DC | PRN

## 2019-01-09 MED ORDER — SODIUM CHLORIDE 0.9 % IV SOLN
Freq: Once | INTRAVENOUS | Status: AC
  Administered 2019-01-09 (×2): via INTRAVENOUS

## 2019-01-09 MED ORDER — HYDROCOD POLST-CPM POLST ER 10-8 MG/5ML PO SUER: 5.0000 mL | Freq: Two times a day (BID) | ORAL | Status: DC | PRN

## 2019-01-09 MED ORDER — SIMVASTATIN 20 MG PO TABS
20.0000 mg | ORAL_TABLET | Freq: Every day | ORAL | Status: DC
  Administered 2019-01-09: 20 mg via ORAL
  Filled 2019-01-09: qty 1

## 2019-01-09 MED ORDER — BENAZEPRIL HCL 20 MG PO TABS
20.0000 mg | ORAL_TABLET | Freq: Every day | ORAL | Status: DC
  Administered 2019-01-09 – 2019-01-10 (×2): 20 mg via ORAL
  Filled 2019-01-09 (×3): qty 1

## 2019-01-09 MED ORDER — PREDNISONE 20 MG PO TABS
60.0000 mg | ORAL_TABLET | Freq: Once | ORAL | Status: AC
  Administered 2019-01-09: 60 mg via ORAL
  Filled 2019-01-09: qty 3

## 2019-01-09 MED ORDER — ONDANSETRON HCL 4 MG PO TABS: 4.0000 mg | ORAL_TABLET | Freq: Four times a day (QID) | ORAL | Status: DC | PRN

## 2019-01-09 MED ORDER — METHYLPREDNISOLONE SODIUM SUCC 125 MG IJ SOLR
60.0000 mg | Freq: Three times a day (TID) | INTRAMUSCULAR | Status: DC
  Administered 2019-01-09 – 2019-01-10 (×4): 60 mg via INTRAVENOUS
  Filled 2019-01-09 (×4): qty 2

## 2019-01-09 MED ORDER — VITAMIN C 500 MG PO TABS
500.0000 mg | ORAL_TABLET | Freq: Every day | ORAL | Status: DC
  Administered 2019-01-09 – 2019-01-10 (×2): 500 mg via ORAL
  Filled 2019-01-09 (×2): qty 1

## 2019-01-09 MED ORDER — CARVEDILOL 3.125 MG PO TABS
6.2500 mg | ORAL_TABLET | Freq: Two times a day (BID) | ORAL | Status: DC
  Administered 2019-01-09 – 2019-01-10 (×3): 6.25 mg via ORAL
  Filled 2019-01-09: qty 2
  Filled 2019-01-09: qty 1
  Filled 2019-01-09: qty 2

## 2019-01-09 MED ORDER — LEVOTHYROXINE SODIUM 50 MCG PO TABS
50.0000 ug | ORAL_TABLET | Freq: Every day | ORAL | Status: DC
  Administered 2019-01-09 – 2019-01-10 (×2): 50 ug via ORAL
  Filled 2019-01-09 (×4): qty 1

## 2019-01-09 NOTE — H&P (Addendum)
History and Physical    Wendy Cooke [id number] DOB: 1940/10/20 DOA: 01/08/2019  Referring MD/NP/PA: Ripley Fraise, MD PCP: Beckie Salts, MD  Patient coming from: Home via EMS  Chief Complaint: Shortness of  I have personally briefly reviewed patient's old medical records in [place]   HPI: Wendy Cooke is a 78 y.o. female with medical history significant of HTN, HLD, COPD, systolic CHF EF 35 to [id number], hypothyroidism, obesity, GERD, remote smoking history, and COVID-19 infection; who presents with complaints of shortness of breath.  Review of records noted patient tested positive for COVID-19 back in the beginning part of August.  On outpatient basis with prednisone and Mucinex DM.  She tested positive for COVID-19 on 9/4.  At that time she was hospitalized at [facility] from 9/4-9/10 for hypercarbicrespiratory failure secondary to COVID-19 and required intubation.  Then readmitted to their hospital from 9/12- 9/18 for hypercarbic respiratory failure requiring BiPAP, dehydration, and altered mental status.  She had been placed on Xarelto due to elevated D-dimers for DVT prophylaxis until September 24.  Scheduled follow-up appointments with cardiology for need of stress testing due to decreased heart function as seen per previous discharge summaries.  Since being home patient reports that she was doing okay, and had not been in contact with anyone except her husband.  Over the last 2 days patient reports that she has intermittently been breaking out in cold sweats.  Yesterday, she started coughing and wheezing.  Tried using home inhaler and nebulizer treatments without relief of shortness of breath.  Denies having any fever, nausea, vomiting, abdominal pain, dysuria, chest pain, hemoptysis.   She states that her appetite has been fine.  ED Course: On admission into the emergency department patient was noted to be afebrile, respirations 15-22, O2 saturations 88% on  room air while resting, and improved with 2 L of nasal cannula oxygen.  Labs noted WBC 7, hemoglobin 11.7, BUN 18, creatinine 1.17, and BNP 44.1.  COVID-19 screening positive.  Chest x-ray showed no acute cardiopulmonary disease and aortic atherosclerosis.  Patient was given 60 mg of prednisone p.o. and 8 puffs of albuterol inhaler.  TRH called to admit.  Review of Systems  Constitutional: Negative for chills, fever and weight loss.  HENT: Negative for ear pain and sore throat.   Eyes: Negative for photophobia and pain.  Respiratory: Positive for cough and shortness of breath.   Cardiovascular: Positive for leg swelling. Negative for chest pain and palpitations.  Gastrointestinal: Negative for abdominal pain, diarrhea, nausea and vomiting.  Genitourinary: Negative for dysuria and hematuria.  Musculoskeletal: Negative for falls and myalgias.  Skin: Negative for rash.  Neurological: Negative for focal weakness and loss of consciousness.  Psychiatric/Behavioral: Negative for memory loss and substance abuse.    Past Medical History:  Diagnosis Date   Asthma    CHF (congestive heart failure) (HCC)    COPD (chronic obstructive pulmonary disease) ([place])    Hypertension     Past Surgical History:  Procedure Laterality Date   ABDOMINAL HYSTERECTOMY     JOINT REPLACEMENT Bilateral    knees     reports that she has never smoked. She has never used smokeless tobacco. She reports that she does not drink alcohol or use drugs.  No Known Allergies  Family History  Problem Relation Age of Onset   Cancer Mother    Cancer Father    Diabetes Sister     Current Outpatient Medications on File Prior to  Encounter  Medication Sig Dispense Refill   albuterol (PROVENTIL) (2.5 MG/3ML) 0.083% nebulizer solution Inhale 2.5 mg into the lungs every 4 (four) hours as needed for wheezing.     albuterol (VENTOLIN HFA) 108 (90 Base) MCG/ACT inhaler Inhale 2 puffs into the lungs every 6 (six) hours  as needed for wheezing.     benazepril (LOTENSIN) 20 MG tablet Take 20 mg by mouth daily.     benzonatate (TESSALON) 200 MG capsule Take 200 mg by mouth 3 (three) times daily as needed for cough.     carvedilol (COREG) 6.25 MG tablet Take 6.25 mg by mouth 2 (two) times daily.     citalopram (CELEXA) 20 MG tablet Take 20 mg by mouth daily.     clonazePAM (KLONOPIN) 0.5 MG tablet Take 1 tablet by mouth 3 (three) times daily as needed for anxiety.     dicyclomine (BENTYL) 10 MG capsule Take 10 mg by mouth 4 (four) times daily as needed.     hydrochlorothiazide (HYDRODIURIL) 12.5 MG tablet Take 12.5 mg by mouth daily.     HYDROcodone-acetaminophen (NORCO) 7.5-325 MG tablet Take 1 tablet by mouth 3 (three) times daily as needed for pain.     levothyroxine (SYNTHROID) 50 MCG tablet Take 50 mcg by mouth daily.     montelukast (SINGULAIR) 10 MG tablet Take 10 mg by mouth at bedtime.     rOPINIRole (REQUIP) 0.5 MG tablet Take 1 tablet by mouth 3 (three) times daily.     simvastatin (ZOCOR) 20 MG tablet Take 20 mg by mouth at bedtime.     traZODone (DESYREL) 50 MG tablet Take 50 mg by mouth at bedtime.      Physical Exam:  Constitutional: Elderly female who appears to be mild respiratory distress Vitals:   01/09/19 0530 01/09/19 0531 01/09/19 0600 01/09/19 0630  BP: 122/60  135/62 134/66  Pulse: 67 73 74 64  Resp: (!) 22 19 19  (!) 22  Temp:      TempSrc:      SpO2: 97% 98% 95% 95%   Eyes: PERRL, lids and conjunctivae normal ENMT: Mucous membranes are moist. Posterior pharynx clear of any exudate or lesions.  Neck: normal, supple, no masses, no thyromegaly Respiratory: Diffuse expiratory wheezes appreciated in both lung fields.  Patient currently on 2 L nasal cannula oxygen with O2 saturations around 94%. Cardiovascular: Regular rate and rhythm, no murmurs / rubs / gallops.  1+ pitting lower extremity edema bilaterally. 2+ pedal pulses. No carotid bruits.  Abdomen: no tenderness,  no masses palpated. No hepatosplenomegaly. Bowel sounds positive.  Musculoskeletal: no clubbing / cyanosis. No joint deformity upper and lower extremities. Good ROM, no contractures. Normal muscle tone.  Skin: no rashes, lesions, ulcers. No induration Neurologic: CN 2-12 grossly intact. Sensation intact, DTR normal. Strength 5/5 in all 4.  Psychiatric: Normal judgment and insight. Alert and oriented x 3. Normal mood.     Labs on Admission: I have personally reviewed following labs and imaging studies  CBC: Recent Labs  Lab 01/08/19 2148  WBC 7.0  NEUTROABS 4.5  HGB 11.7*  HCT 36.8  MCV 96.8  PLT 0000000   Basic Metabolic Panel: Recent Labs  Lab 01/08/19 2148  NA 138  K 3.7  CL 99  CO2 25  GLUCOSE 122*  BUN 18  CREATININE 1.17*  CALCIUM 9.2   GFR: CrCl cannot be calculated (Unknown ideal weight.). Liver Function Tests: Recent Labs  Lab 01/08/19 2148  AST 17  ALT 11  ALKPHOS 46  BILITOT 0.7  PROT 6.6  ALBUMIN 3.5   No results for input(s): LIPASE, AMYLASE in the last 168 hours. No results for input(s): AMMONIA in the last 168 hours. Coagulation Profile: No results for input(s): INR, PROTIME in the last 168 hours. Cardiac Enzymes: No results for input(s): CKTOTAL, CKMB, CKMBINDEX, TROPONINI in the last 168 hours. BNP (last 3 results) No results for input(s): PROBNP in the last 8760 hours. HbA1C: No results for input(s): HGBA1C in the last 72 hours. CBG: No results for input(s): GLUCAP in the last 168 hours. Lipid Profile: No results for input(s): CHOL, HDL, LDLCALC, TRIG, CHOLHDL, LDLDIRECT in the last 72 hours. Thyroid Function Tests: No results for input(s): TSH, T4TOTAL, FREET4, T3FREE, THYROIDAB in the last 72 hours. Anemia Panel: No results for input(s): VITAMINB12, FOLATE, FERRITIN, TIBC, IRON, RETICCTPCT in the last 72 hours. Urine analysis: No results found for: COLORURINE, APPEARANCEUR, LABSPEC, [place], GLUCOSEU, [place], [place], [place],  [place], UROBILINOGEN, NITRITE, LEUKOCYTESUR Sepsis Labs: Recent Results (from the past 240 hour(s))  SARS Coronavirus 2 [facility] order, Performed in [facility] hospital lab) Nasopharyngeal Nasopharyngeal Swab     Status: Abnormal   Collection Time: 01/09/19  4:19 AM   Specimen: Nasopharyngeal Swab  Result Value Ref Range Status   SARS Coronavirus 2 POSITIVE (A) NEGATIVE Final    Comment: RESULT CALLED TO, READ BACK BY AND VERIFIED WITH: RN B OSORIO @ [phone] 01/09/19 BY S GEZAHEGN (NOTE) If result is NEGATIVE SARS-CoV-2 target nucleic acids are NOT DETECTED. The SARS-CoV-2 RNA is generally detectable in upper and lower  respiratory specimens during the acute phase of infection. The lowest  concentration of SARS-CoV-2 viral copies this assay can detect is 250  copies / mL. A negative result does not preclude SARS-CoV-2 infection  and should not be used as the sole basis for treatment or other  patient management decisions.  A negative result may occur with  improper specimen collection / handling, submission of specimen other  than nasopharyngeal swab, presence of viral mutation(s) within the  areas targeted by this assay, and inadequate number of viral copies  (<250 copies / mL). A negative result must be combined with clinical  observations, patient history, and epidemiological information. If result is POSITIVE SARS-CoV-2 target nucleic acids are DETECTE D. The SARS-CoV-2 RNA is generally detectable in upper and lower  respiratory specimens during the acute phase of infection.  Positive  results are indicative of active infection with SARS-CoV-2.  Clinical  correlation with patient history and other diagnostic information is  necessary to determine patient infection status.  Positive results do  not rule out bacterial infection or co-infection with other viruses. If result is PRESUMPTIVE POSTIVE SARS-CoV-2 nucleic acids MAY BE PRESENT.   A presumptive positive result was  obtained on the submitted specimen  and confirmed on repeat testing.  While 2019 novel coronavirus  (SARS-CoV-2) nucleic acids may be present in the submitted sample  additional confirmatory testing may be necessary for epidemiological  and / or clinical management purposes  to differentiate between  SARS-CoV-2 and other Sarbecovirus currently known to infect humans.  If clinically indicated additional testing with an alternate test  methodology (LAB745 3) is advised. The SARS-CoV-2 RNA is generally  detectable in upper and lower respiratory specimens during the acute  phase of infection. The expected result is Negative. Fact Sheet for Patients:  StrictlyIdeas.no Fact Sheet for Healthcare Providers: BankingDealers.co.za This test is not yet approved or cleared by the [place] FDA and  has been authorized for detection and/or diagnosis of SARS-CoV-2 by FDA under an Emergency Use Authorization (EUA).  This EUA will remain in effect (meaning this test can be used) for the duration of the COVID-19 declaration under Section 564(b)(1) of the Act, 21 U.S.C. section 360bbb-3(b)(1), unless the authorization is terminated or revoked sooner. Performed at [facility], [place] [address], [place], [place] [address]      Radiological Exams on Admission: Dg Chest 2 View  Result Date: 01/08/2019 CLINICAL DATA:  78 year old female with history of nonproductive cough and wheezing. EXAM: CHEST - 2 VIEW COMPARISON:  Chest x-ray 12/18/2018. FINDINGS: Multiple tiny subcentimeter pulmonary nodules scattered throughout the lungs bilaterally appear very dense, presumably calcified granulomas. Lung volumes are normal. No consolidative airspace disease. No pleural effusions. No pneumothorax. No suspicious appearing pulmonary nodule or mass noted. Pulmonary vasculature and the cardiomediastinal silhouette are within normal limits. Atherosclerotic calcifications  in the thoracic aorta. IMPRESSION: 1.  No radiographic evidence of acute cardiopulmonary disease. 2. Aortic atherosclerosis. Electronically Signed   By: Vinnie Langton M.D.   On: 01/08/2019 22:10    EKG: Independently reviewed.  Sinus rhythm at 78 bpm with artifact present  Assessment/Plan Acute respiratory failure with hypoxia secondary to COPD exacerbation and COVID-19 virus: Recurrent.  Patient presents with complaints of cough, wheezing, and shortness of breath.  Chest x-ray otherwise clear.  Patient with positive COVID-19 screenings since August.  Patient notes remote history of tobacco use.  O2 saturations reported to be as low as 88% on room air while the patient was resting, but this could be associated with patient's history of sleep apnea.  She is currently maintaining O2 saturations on 2 L of nasal cannula oxygen.  He received 60 mg of prednisone p.o. in the emergency department. -Admit to the medical telemetry bed at [place] admission order set utilized with contact precautions -Continuous pulse oximetry with nasal cannula oxygen as needed to maintain O2 saturation -Check inflammatory markers stat(including LDH, ferritin, procalcitonin, CRP,etc) -Albuterol inhaler every 6 hours -Solu-Medrol 60 mg IV every 8 hours -Antitussives as needed -Vitamin C and zinc -Daily monitoring of inflammatory markers   Acute kidney injury: Patient's baseline creatinine appears to be around 0.6-0.7, but patient presents with creatinine elevated up to 1.17 with BUN 18.  Patient is on diuretics.  She reports having a decent appetite, but with recent cold sweats question aspect of dehydration. -Check urine creatinine, sodium, urea -Give saline IV fluids at 75 mL/h for total of 500 mL -Recheck BMP in a.m.  Systolic congestive heart failure: Patient appears to be euvolemic this time.  Chest x-ray otherwise clear and 1+ pitting edema on physical exam.  BNP of 44.1 not suggestive of  fluid overload. Echocardiogram from 12/15/2018 at [facility] showed EF of 35 to 45% with mild global LV hypokinesis, small aortic regurgitation, and mild mitral regurgitation.  Patient followed up in the outpatient setting with cardiology and in the process of being scheduled for nuclear stress test. -Strict I&Os -Daily weights  Essential hypertension: Patient blood pressure currently well controlled -Held hydrochlorothiazide due to AKI.  Follow-up urine studies and restart when medically appropriate -Continue benazepril and Coreg  Normocytic anemia: Hemoglobin 11.7 which appears near patient's baseline.  Patient does not report any complaints of bleeding. -Continue to monitor  Hypothyroidism -Continue levothyroxine  Anxiety -Continue Celexa and Klonopin as needed for anxiety  Hyperlipidemia -Continue simvastatin   Obesity: BMI 32.3 kg/m  OSA: Patient on 2  L nasal cannula oxygen at night as previously has not tolerated CPAP. -Continue 2 L nasal cannula oxygen at night  Will need to review previous discharge summaries for complete details regarding previous recommendations regarding follow-up with cardiology, liver findings, etc.  DVT prophylaxis: Lovenox Code Status: Full Family Communication: Family to be updated Disposition Plan: To be determined Consults called: none Admission status: Inpatient   Norval Morton MD [facility] Pager [phone]   If 7PM-7AM, please contact night-coverage [email or web address] Password [email or web address]  01/09/2019, 7:18 AM

## 2019-01-09 NOTE — Progress Notes (Signed)
MD aware that CT machine is down. MD states there is no need to have CT tonight, if machine is still down tomorrow, then plan to transport off campus to have CT done. Charge and [facility] aware.

## 2019-01-09 NOTE — ED Notes (Signed)
Report called to [facility], to [place], Therapist, sports; [place] notified

## 2019-01-09 NOTE — ED Notes (Addendum)
Heart Healthy Breakfast tray ordered

## 2019-01-09 NOTE — ED Notes (Signed)
Lunch tray ordered 

## 2019-01-09 NOTE — ED Provider Notes (Signed)
[place] EMERGENCY DEPARTMENT Provider Note   CSN: [id number] Arrival date & time: 01/08/19  2116     History   Chief Complaint Chief Complaint  Patient presents with  . Shortness of Breath    HPI Wendy Cooke is a 78 y.o. female.     The history is provided by the patient.  Shortness of Breath Severity:  Moderate Onset quality:  Gradual Duration:  1 day Timing:  Intermittent Progression:  Worsening Chronicity:  Recurrent Relieved by:  Nothing Worsened by:  Nothing Associated symptoms: cough and wheezing   Associated symptoms: no chest pain, no fever, no hemoptysis and no vomiting   Patient presents with cough and wheezing.  Patient reports that she was admitted twice in September after having respiratory issues due to COVID-19.  She reports she improved and went home.  However over the past day she has had increasing cough, shortness of breath and wheezing. No fevers or vomiting.  No chest pain or hemoptysis.  She is a non-smoker.  She reports she did not have any these issues prior to contracting COVID-19  Past Medical History:  Diagnosis Date  . Asthma   . CHF (congestive heart failure) ([place])   . COPD (chronic obstructive pulmonary disease) ([place])   . Hypertension     There are no active problems to display for this patient.   Past Surgical History:  Procedure Laterality Date  . ABDOMINAL HYSTERECTOMY    . JOINT REPLACEMENT Bilateral    knees     OB History   No obstetric history on file.      Home Medications    Prior to Admission medications   Not on File    Family History No family history on file.  Social History Social History   Tobacco Use  . Smoking status: Never Smoker  . Smokeless tobacco: Never Used  Substance Use Topics  . Alcohol use: Never    Frequency: Never  . Drug use: Never     Allergies   Patient has no known allergies.   Review of Systems Review of Systems  Constitutional: Positive for  chills. Negative for fever.  Respiratory: Positive for cough, shortness of breath and wheezing. Negative for hemoptysis.   Cardiovascular: Positive for leg swelling. Negative for chest pain.  Gastrointestinal: Negative for vomiting.  All other systems reviewed and are negative.    Physical Exam Updated Vital Signs BP (!) 157/88   Pulse 72   Temp 98.4 F (36.9 C) (Oral)   Resp (!) 21   SpO2 96%   Physical Exam CONSTITUTIONAL: Elderly, coughs frequently during exam HEAD: Normocephalic/atraumatic EYES: EOMI/PERRL NECK: supple no meningeal signs SPINE/BACK:entire spine nontender CV: S1/S2 noted, no murmurs/rubs/gallops noted LUNGS: Tachypnea, wheezing bilaterally ABDOMEN: soft, nontender, no rebound or guarding, bowel sounds noted throughout abdomen GU:no cva tenderness NEURO: Pt is awake/alert/appropriate, moves all extremitiesx4.  No facial droop.   EXTREMITIES: pulses normal/equal, full ROM, symmetric pitting edema bilateral lower extremities SKIN: warm, color normal PSYCH: no abnormalities of mood noted, alert and oriented to situation   ED Treatments / Results  Labs (all labs ordered are listed, but only abnormal results are displayed) Labs Reviewed  SARS CORONAVIRUS 2 (HOSPITAL ORDER, [place] LAB) - Abnormal; Notable for the following components:      Result Value   SARS Coronavirus 2 POSITIVE (*)    All other components within normal limits  CBC WITH DIFFERENTIAL/PLATELET - Abnormal; Notable for the following components:  RBC 3.80 (*)    Hemoglobin 11.7 (*)    All other components within normal limits  COMPREHENSIVE METABOLIC PANEL - Abnormal; Notable for the following components:   Glucose, Bld 122 (*)    Creatinine, Ser 1.17 (*)    GFR calc non Af Amer 45 (*)    GFR calc Af Amer 52 (*)    All other components within normal limits  BRAIN NATRIURETIC PEPTIDE    EKG EKG Interpretation  Date/Time:  Saturday January 08 2019 21:45:40  EDT Ventricular Rate:  78 PR Interval:  142 QRS Duration: 58 QT Interval:  354 QTC Calculation: 403 R Axis:   63 Text Interpretation:  Normal sinus rhythm Nonspecific ST abnormality Abnormal ECG Interpretation limited secondary to artifact No previous ECGs available Confirmed by Ripley Fraise [phone]) on 01/09/2019 3:24:27 AM   Radiology Dg Chest 2 View  Result Date: 01/08/2019 CLINICAL DATA:  78 year old female with history of nonproductive cough and wheezing. EXAM: CHEST - 2 VIEW COMPARISON:  Chest x-ray 12/18/2018. FINDINGS: Multiple tiny subcentimeter pulmonary nodules scattered throughout the lungs bilaterally appear very dense, presumably calcified granulomas. Lung volumes are normal. No consolidative airspace disease. No pleural effusions. No pneumothorax. No suspicious appearing pulmonary nodule or mass noted. Pulmonary vasculature and the cardiomediastinal silhouette are within normal limits. Atherosclerotic calcifications in the thoracic aorta. IMPRESSION: 1.  No radiographic evidence of acute cardiopulmonary disease. 2. Aortic atherosclerosis. Electronically Signed   By: Vinnie Langton M.D.   On: 01/08/2019 22:10    Procedures Procedures    Medications Ordered in ED Medications  predniSONE (DELTASONE) tablet 60 mg (has no administration in time range)  albuterol (VENTOLIN HFA) 108 (90 Base) MCG/ACT inhaler 8 puff (has no administration in time range)     Initial Impression / Assessment and Plan / ED Course  I have reviewed the triage vital signs and the nursing notes.  Pertinent labs & imaging results that were available during my care of the patient were reviewed by me and considered in my medical decision making (see chart for details).        4:05 AM Per records, patient was admitted twice in September.  She did require intubation previously.  She is on oxygen at night per records. We will give albuterol and prednisone and reassess.  She will need to be retested  for COVID-19 6:58 AM Patient with positive COVID-19.  Review of chart reveals patient had a positive test on September 4.  It is unclear if this is a reinfection as patient is now symptomatic again requiring oxygen. Patient will be admitted to the hospital Pt still feeling SOB though somewhat improved 7:25 AM Discussed with Dr. Tamala Julian for admission  Wendy Cooke was evaluated in Emergency Department on 01/09/2019 for the symptoms described in the history of present illness. She was evaluated in the context of the global COVID-19 pandemic, which necessitated consideration that the patient might be at risk for infection with the SARS-CoV-2 virus that causes COVID-19. Institutional protocols and algorithms that pertain to the evaluation of patients at risk for COVID-19 are in a state of rapid change based on information released by regulatory bodies including the CDC and federal and state organizations. These policies and algorithms were followed during the patient's care in the ED.   Final Clinical Impressions(s) / ED Diagnoses   Final diagnoses:  [id number]  Bronchospasm    ED Discharge Orders    None       Ripley Fraise, MD 01/09/19 [id number]

## 2019-01-09 NOTE — ED Notes (Signed)
Pt. 91% on room air placed on 2 L O2 increased to 95%.

## 2019-01-09 NOTE — ED Notes (Signed)
RN called to check on update on status of COVID swab, per lab 40 minutes remaining

## 2019-01-09 NOTE — ED Notes (Signed)
Breakfast tray at bedside 

## 2019-01-09 NOTE — ED Notes (Signed)
Lab to result BNP from previous blood collection

## 2019-01-09 NOTE — ED Notes (Addendum)
Pt states improved SOB. RN discontinued O2 at this time. Pt. 96-97% on room air with RR 19-22. Dr. Christy Gentles updated

## 2019-01-09 NOTE — ED Notes (Signed)
Pt sleeping SpO2 decreased to 88-90%, pt placed back to 2 L. SpO2 95-96%

## 2019-01-10 ENCOUNTER — Encounter (HOSPITAL_COMMUNITY): Payer: Self-pay | Admitting: *Deleted

## 2019-01-10 ENCOUNTER — Ambulatory Visit (HOSPITAL_COMMUNITY)
Admission: RE | Admit: 2019-01-10 | Discharge: 2019-01-10 | Disposition: A | Discharge status: Home / Self Care | Payer: PPO | Source: Ambulatory Visit | Attending: Internal Medicine | Admitting: Internal Medicine

## 2019-01-10 ENCOUNTER — Encounter (HOSPITAL_COMMUNITY): Payer: Self-pay

## 2019-01-10 DIAGNOSIS — I2510 Atherosclerotic heart disease of native coronary artery without angina pectoris: Secondary | ICD-10-CM | POA: Diagnosis not present

## 2019-01-10 DIAGNOSIS — I700 Atherosclerosis of aorta: Secondary | ICD-10-CM | POA: Diagnosis not present

## 2019-01-10 DIAGNOSIS — J9801 Acute bronchospasm: Secondary | ICD-10-CM

## 2019-01-10 DIAGNOSIS — R0602 Shortness of breath: Secondary | ICD-10-CM | POA: Diagnosis not present

## 2019-01-10 DIAGNOSIS — R918 Other nonspecific abnormal finding of lung field: Secondary | ICD-10-CM | POA: Insufficient documentation

## 2019-01-10 LAB — TYPE AND SCREEN
ABO/RH(D): O POS
Antibody Screen: NEGATIVE

## 2019-01-10 LAB — UREA NITROGEN, URINE: Urea Nitrogen, Ur: 336 mg/dL

## 2019-01-10 LAB — BASIC METABOLIC PANEL
Anion gap: 11 (ref 5–15)
BUN: 16 mg/dL (ref 8–23)
CO2: 26 mmol/L (ref 22–32)
Calcium: 9.3 mg/dL (ref 8.9–10.3)
Chloride: 102 mmol/L (ref 98–111)
Creatinine, Ser: 0.7 mg/dL (ref 0.44–1.00)
GFR calc Af Amer: >60 mL/min (ref >60)
GFR calc non Af Amer: >60 mL/min (ref >60)
Glucose, Bld: 169 mg/dL — ABNORMAL HIGH (ref 70–99)
Potassium: 4 mmol/L (ref 3.5–5.1)
Sodium: 139 mmol/L (ref 135–145)

## 2019-01-10 LAB — ABO/RH: ABO/RH(D): O POS

## 2019-01-10 MED ORDER — IOHEXOL 350 MG/ML SOLN
100.0000 mL | Freq: Once | INTRAVENOUS | Status: AC | PRN
  Administered 2019-01-10: 15:00:00 100 mL via INTRAVENOUS

## 2019-01-10 MED ORDER — PREDNISONE 10 MG PO TABS: 40.0000 mg | ORAL_TABLET | Freq: Every day | ORAL | 0 refills | Status: AC

## 2019-01-10 MED ORDER — SODIUM CHLORIDE (PF) 0.9 % IJ SOLN
INTRAMUSCULAR | Status: AC
  Filled 2019-01-10: qty 50

## 2019-01-10 NOTE — Discharge Summary (Signed)
Physician Discharge Summary  Wendy Cooke [id number] DOB: 1941/03/14 DOA: 01/08/2019  PCP: Beckie Salts, MD  Admit date: 01/08/2019 Discharge date: 01/10/2019  Admitted From: home Disposition:  home  Recommendations for Outpatient Follow-up:  1. Follow up with PCP in 1-2 weeks 2. Follow up with Pulmonary in 2-3 weeks for COPD  Home Health: none Equipment/Devices: none  Discharge Condition: stable CODE STATUS: Full code Diet recommendation: regular  HPI: Per admitting MD, Wendy Cooke is a 78 y.o. female with medical history significant of HTN, HLD, COPD, systolic CHF EF 35 to [id number], hypothyroidism, obesity, GERD, remote smoking history, and COVID-19 infection; who presents with complaints of shortness of breath. Review of records noted patient tested positive for COVID-19 back in the beginning part of August.  On outpatient basis with prednisone and Mucinex DM.  She tested positive for COVID-19 on 9/4.  At that time she was hospitalized at [facility] from 9/4-9/10 for hypercarbicrespiratory failure secondary to COVID-19 and required intubation.  Then readmitted to their hospital from 9/12- 9/18 for hypercarbic respiratory failure requiring BiPAP, dehydration, and altered mental status.  She had been placed on Xarelto due to elevated D-dimers for DVT prophylaxis until September 24.  Scheduled follow-up appointments with cardiology for need of stress testing due to decreased heart function as seen per previous discharge summaries.  Since being home patient reports that she was doing okay, and had not been in contact with anyone except her husband.  Over the last 2 days patient reports that she has intermittently been breaking out in cold sweats.  Yesterday, she started coughing and wheezing.  Tried using home inhaler and nebulizer treatments without relief of shortness of breath.  Denies having any fever, nausea, vomiting, abdominal pain, dysuria, chest pain, hemoptysis.    She states that her appetite has been fine. ED Course: On admission into the emergency department patient was noted to be afebrile, respirations 15-22, O2 saturations 88% on room air while resting, and improved with 2 L of nasal cannula oxygen.  Labs noted WBC 7, hemoglobin 11.7, BUN 18, creatinine 1.17, and BNP 44.1.  COVID-19 screening positive.  Chest x-ray showed no acute cardiopulmonary disease and aortic atherosclerosis.  Patient was given 60 mg of prednisone p.o. and 8 puffs of albuterol inhaler.  TRH called to admit.  Hospital Course: Acute respiratory failure with hypoxia secondary to COPD exacerbation -patient was admitted to the hospital with acute hypoxic respiratory failure due to COPD exacerbation.  She was having a dry cough, wheezing on admission, placed on steroids and supportive treatment with improvement in her respiratory status.  She currently is at baseline, comfortable on room air during daytime (she uses 2 L nasal cannula at night), and will be discharged home in stable condition with a steroid taper.  Her chest x-ray was clear.  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  Per guidelines, since more than 21 days have passed from her initial test and even the test in September she will not need contact precautions.  Her presentation during this hospital stay was not related to her Covid diagnosis Acute kidney injury -resolved with hydration Systolic congestive heart failure -most recent EF 35-45%, diagnosed post Covid, appears clinically euvolemic, has some mild lower extremity edema which  is chronic likely related to venous insufficiency as patient reports that it gets worse upon standing and improves with laying down.  Continue home  medications on discharge, she has follow-up with cardiology as an outpatient for stress test on October 14 Essential hypertension -resume home medications Normocytic anemia- Hemoglobin 11.7 which appears near patient's baseline.  Patient does not report any complaints of bleeding. Hypothyroidism -Continue levothyroxine Anxiety -Continue home medications Hyperlipidemia -Continue home medications Obesity- BMI 32.3 kg/m OSA- Patient on 2 L nasal cannula oxygen at night as previously has not tolerated CPAP. Continue 2 L nasal cannula oxygen at night  Discharge Diagnoses:  Principal Problem:   Acute respiratory failure with hypoxia ([place]) Active Problems:   COVID-19 virus infection   COPD with acute exacerbation (HCC)   AKI (acute kidney injury) (HCC)   Chronic systolic CHF (congestive heart failure) (HCC)   Essential hypertension   Normocytic anemia   Hypothyroidism   Obesity (BMI 30.0-34.9)   OSA (obstructive sleep apnea)   Pressure injury of skin  Discharge Instructions  Allergies as of 01/10/2019   No Known Allergies     Medication List    TAKE these medications   albuterol (2.5 MG/3ML) 0.083% nebulizer solution Commonly known as: PROVENTIL Inhale 2.5 mg into the lungs every 4 (four) hours as needed for wheezing.   albuterol 108 (90 Base) MCG/ACT inhaler Commonly known as: VENTOLIN HFA Inhale 2 puffs into the lungs every 6 (six) hours as needed for wheezing.   benazepril 20 MG tablet Commonly known as: LOTENSIN Take 20 mg by mouth daily.   benzonatate 200 MG capsule Commonly known as: TESSALON Take 200 mg by mouth 3 (three) times daily as needed for cough.   carvedilol 6.25 MG tablet Commonly known as: COREG Take 6.25 mg by mouth 2 (two) times daily.   citalopram 20 MG tablet Commonly known as: CELEXA Take 20 mg by mouth daily.   clonazePAM 0.5 MG tablet Commonly known as: KLONOPIN Take 1 tablet by mouth 3 (three) times daily as needed for anxiety.     dicyclomine 10 MG capsule Commonly known as: BENTYL Take 10 mg by mouth 4 (four) times daily as needed.   hydrochlorothiazide 12.5 MG tablet Commonly known as: HYDRODIURIL Take 12.5 mg by mouth daily.   HYDROcodone-acetaminophen 7.5-325 MG tablet Commonly known as: NORCO Take 1 tablet by mouth 3 (three) times daily as needed for pain.   levothyroxine 50 MCG tablet Commonly known as: SYNTHROID Take 50 mcg by mouth daily.   montelukast 10 MG tablet Commonly known as: SINGULAIR Take 10 mg by mouth at bedtime.   predniSONE 10 MG tablet Commonly known as: DELTASONE Take 4 tablets (40 mg total) by mouth daily. Take 2 tablets (40 mg) for 3 days then 1.5 tablets (30 mg for 3 days) then 1 tablet (20 mg) for 3 days then stop   rOPINIRole 0.5 MG tablet Commonly known as: REQUIP Take 1 tablet by mouth 3 (three) times daily.   simvastatin 20 MG tablet Commonly known as: ZOCOR Take 20 mg by mouth at bedtime.   traZODone 50 MG tablet Commonly known as: DESYREL Take 50 mg by mouth at bedtime.      Follow-up Information    [facility]. Schedule an appointment as soon as possible for a visit in 2 week(s).   Specialty: Pulmonology Contact information: [address] [place] [phone]       Beckie Salts, MD. Schedule an appointment as  soon as possible for a visit in 2 week(s).   Specialty: Internal Medicine         Consultations:  None   Procedures/Studies: Dg Chest 2 View  Result Date: 01/08/2019 CLINICAL DATA:  78 year old female with history of nonproductive cough and wheezing. EXAM: CHEST - 2 VIEW COMPARISON:  Chest x-ray 12/18/2018. FINDINGS: Multiple tiny subcentimeter pulmonary nodules scattered throughout the lungs bilaterally appear very dense, presumably calcified granulomas. Lung volumes are normal. No consolidative airspace disease. No pleural effusions. No pneumothorax. No suspicious appearing pulmonary  nodule or mass noted. Pulmonary vasculature and the cardiomediastinal silhouette are within normal limits. Atherosclerotic calcifications in the thoracic aorta. IMPRESSION: 1.  No radiographic evidence of acute cardiopulmonary disease. 2. Aortic atherosclerosis. Electronically Signed   By: Vinnie Langton M.D.   On: 01/08/2019 22:10   Ct Angio Chest Pe W Or Wo Contrast  Result Date: 01/10/2019 CLINICAL DATA:  Cough and shortness of breath.  Leg swelling. EXAM: CT ANGIOGRAPHY CHEST WITH CONTRAST TECHNIQUE: Multidetector CT imaging of the chest was performed using the standard protocol during bolus administration of intravenous contrast. Multiplanar CT image reconstructions and MIPs were obtained to evaluate the vascular anatomy. CONTRAST:  155mL OMNIPAQUE IOHEXOL 350 MG/ML SOLN COMPARISON:  Chest x-ray January 08, 2019. Chest CT December 10, 2018. FINDINGS: Cardiovascular: The heart size remains borderline. Coronary artery calcifications are noted. Atherosclerotic changes are seen in the nonaneurysmal aorta, relatively mild. No dissection or aneurysm. Evaluation for pulmonary emboli is somewhat limited due to respiratory motion. Taking the resulting stairstep artifact into account, no pulmonary emboli are identified. Mediastinum/Nodes: There is fat in the left pleural space such as on axial image 97, of no significance. The anterior chest wall is normal. Thyroid is unremarkable. Calcified nodes are scattered throughout the mediastinum with no suspicious adenopathy. No effusions. The esophagus is unremarkable. Lungs/Pleura: Central airways are normal. No pneumothorax. Evaluation of the lung parenchyma is limited due to respiratory motion. There is some scarring in the apices, left greater than right. A few scattered calcified granulomas are identified. Several other nodules are seen in the lungs, completely described on the September fourth 2020 CT scan of the chest. A representative nodule is seen on series 6,  image 75 measuring 6 mm on both studies. No new nodules. No focal infiltrates are identified taking respiratory motion into account. No masses. Upper Abdomen: Stable nodularity in the left adrenal gland, better assessed on the previous study. No acute abnormalities in the upper abdomen. Musculoskeletal: No chest wall abnormality. No acute or significant osseous findings. Review of the MIP images confirms the above findings. IMPRESSION: 1. Evaluation for pulmonary emboli is somewhat limited due to respiratory motion and stairstep artifact. No emboli seen. 2. Stable nodularity in the lungs. A 3 to six-month follow-up CT scan was recommended on the December 10, 2018 study report. 3. Stable nodularity in the left adrenal gland. 4. Mild atherosclerotic change in the thoracic aorta. Coronary artery calcifications. Aortic Atherosclerosis (ICD10-I70.0). Electronically Signed   By: Dorise Bullion III M.D   On: 01/10/2019 15:54     Subjective: - no chest pain, shortness of breath, no abdominal pain, nausea or vomiting.   Discharge Exam: BP (!) 114/59 (BP Location: Right Arm)    Pulse (!) 57    Temp 98 F (36.7 C) (Oral)    Resp 20    Ht 5\' 6"  (1.676 m)    Wt 90.7 kg    SpO2 94%    BMI 32.28 kg/m  General: Pt is alert, awake, not in acute distress Cardiovascular: RRR, S1/S2 +, no rubs, no gallops Respiratory: CTA bilaterally, no wheezing, no rhonchi Abdominal: Soft, NT, ND, bowel sounds + Extremities: no edema, no cyanosis   The results of significant diagnostics from this hospitalization (including imaging, microbiology, ancillary and laboratory) are listed below for reference.     Microbiology: Recent Results (from the past 240 hour(s))  SARS Coronavirus 2 [facility] order, Performed in [facility] hospital lab) Nasopharyngeal Nasopharyngeal Swab     Status: Abnormal   Collection Time: 01/09/19  4:19 AM   Specimen: Nasopharyngeal Swab  Result Value Ref Range Status   SARS Coronavirus 2 POSITIVE  (A) NEGATIVE Final    Comment: RESULT CALLED TO, READ BACK BY AND VERIFIED WITH: RN B OSORIO @ [phone] 01/09/19 BY S GEZAHEGN (NOTE) If result is NEGATIVE SARS-CoV-2 target nucleic acids are NOT DETECTED. The SARS-CoV-2 RNA is generally detectable in upper and lower  respiratory specimens during the acute phase of infection. The lowest  concentration of SARS-CoV-2 viral copies this assay can detect is 250  copies / mL. A negative result does not preclude SARS-CoV-2 infection  and should not be used as the sole basis for treatment or other  patient management decisions.  A negative result may occur with  improper specimen collection / handling, submission of specimen other  than nasopharyngeal swab, presence of viral mutation(s) within the  areas targeted by this assay, and inadequate number of viral copies  (<250 copies / mL). A negative result must be combined with clinical  observations, patient history, and epidemiological information. If result is POSITIVE SARS-CoV-2 target nucleic acids are DETECTE D. The SARS-CoV-2 RNA is generally detectable in upper and lower  respiratory specimens during the acute phase of infection.  Positive  results are indicative of active infection with SARS-CoV-2.  Clinical  correlation with patient history and other diagnostic information is  necessary to determine patient infection status.  Positive results do  not rule out bacterial infection or co-infection with other viruses. If result is PRESUMPTIVE POSTIVE SARS-CoV-2 nucleic acids MAY BE PRESENT.   A presumptive positive result was obtained on the submitted specimen  and confirmed on repeat testing.  While 2019 novel coronavirus  (SARS-CoV-2) nucleic acids may be present in the submitted sample  additional confirmatory testing may be necessary for epidemiological  and / or clinical management purposes  to differentiate between  SARS-CoV-2 and other Sarbecovirus currently known to infect humans.  If  clinically indicated additional testing with an alternate test  methodology (LAB745 3) is advised. The SARS-CoV-2 RNA is generally  detectable in upper and lower respiratory specimens during the acute  phase of infection. The expected result is Negative. Fact Sheet for Patients:  StrictlyIdeas.no Fact Sheet for Healthcare Providers: BankingDealers.co.za This test is not yet approved or cleared by the [place] FDA and has been authorized for detection and/or diagnosis of SARS-CoV-2 by FDA under an Emergency Use Authorization (EUA).  This EUA will remain in effect (meaning this test can be used) for the duration of the COVID-19 declaration under Section 564(b)(1) of the Act, 21 U.S.C. section 360bbb-3(b)(1), unless the authorization is terminated or revoked sooner. Performed at [facility], [place] [address] [place], [place] [address]      Labs: BNP (last 3 results) Recent Labs    01/08/19 2148  BNP [id number]   Basic Metabolic Panel: Recent Labs  Lab 01/08/19 2148 01/10/19 0215  NA 138 139  K  3.7 4.0  CL 99 102  CO2 25 26  GLUCOSE 122* 169*  BUN 18 16  CREATININE 1.17* 0.70  CALCIUM 9.2 9.3   Liver Function Tests: Recent Labs  Lab 01/08/19 2148  AST 17  ALT 11  ALKPHOS 46  BILITOT 0.7  PROT 6.6  ALBUMIN 3.5   No results for input(s): LIPASE, AMYLASE in the last 168 hours. No results for input(s): AMMONIA in the last 168 hours. CBC: Recent Labs  Lab 01/08/19 2148  WBC 7.0  NEUTROABS 4.5  HGB 11.7*  HCT 36.8  MCV 96.8  PLT 246   Cardiac Enzymes: No results for input(s): CKTOTAL, CKMB, CKMBINDEX, TROPONINI in the last 168 hours. BNP: Invalid input(s): POCBNP CBG: No results for input(s): GLUCAP in the last 168 hours. D-Dimer Recent Labs    01/09/19 0818  DDIMER 7.53*   Hgb A1c No results for input(s): HGBA1C in the last 72 hours. Lipid Profile No results for input(s): CHOL, HDL, LDLCALC,  TRIG, CHOLHDL, LDLDIRECT in the last 72 hours. Thyroid function studies No results for input(s): TSH, T4TOTAL, T3FREE, THYROIDAB in the last 72 hours.  Invalid input(s): FREET3 Anemia work up Recent Labs    01/09/19 0818  FERRITIN 249   Urinalysis    Component Value Date/Time   COLORURINE YELLOW 01/09/2019 0818   APPEARANCEUR CLEAR 01/09/2019 0818   LABSPEC 1.008 01/09/2019 0818   PHURINE 5.0 01/09/2019 0818   GLUCOSEU NEGATIVE 01/09/2019 0818   HGBUR NEGATIVE 01/09/2019 0818   BILIRUBINUR NEGATIVE 01/09/2019 0818   KETONESUR NEGATIVE 01/09/2019 0818   PROTEINUR NEGATIVE 01/09/2019 0818   NITRITE NEGATIVE 01/09/2019 0818   LEUKOCYTESUR TRACE (A) 01/09/2019 0818   Sepsis Labs Invalid input(s): PROCALCITONIN,  WBC,  LACTICIDVEN  FURTHER DISCHARGE INSTRUCTIONS:   Get Medicines reviewed and adjusted: Please take all your medications with you for your next visit with your Primary MD   Laboratory/radiological data: Please request your Primary MD to go over all hospital tests and procedure/radiological results at the follow up, please ask your Primary MD to get all Hospital records sent to his/her office.   In some cases, they will be blood work, cultures and biopsy results pending at the time of your discharge. Please request that your primary care M.D. goes through all the records of your hospital data and follows up on these results.   Also Note the following: If you experience worsening of your admission symptoms, develop shortness of breath, life threatening emergency, suicidal or homicidal thoughts you must seek medical attention immediately by calling 911 or calling your MD immediately  if symptoms less severe.   You must read complete instructions/literature along with all the possible adverse reactions/side effects for all the Medicines you take and that have been prescribed to you. Take any new Medicines after you have completely understood and accpet all the possible  adverse reactions/side effects.    Do not drive when taking Pain medications or sleeping medications (Benzodaizepines)   Do not take more than prescribed Pain, Sleep and Anxiety Medications. It is not advisable to combine anxiety,sleep and pain medications without talking with your primary care practitioner   Special Instructions: If you have smoked or chewed Tobacco  in the last 2 yrs please stop smoking, stop any regular Alcohol  and or any Recreational drug use.   Wear Seat belts while driving.   Please note: You were cared for by a hospitalist during your hospital stay. Once you are discharged, your primary care physician will handle  any further medical issues. Please note that NO REFILLS for any discharge medications will be authorized once you are discharged, as it is imperative that you return to your primary care physician (or establish a relationship with a primary care physician if you do not have one) for your post hospital discharge needs so that they can reassess your need for medications and monitor your lab values.  Time coordinating discharge: 40 minutes  SIGNED:  Marzetta Board, MD, PhD 01/10/2019, 4:45 PM

## 2019-01-10 NOTE — Plan of Care (Addendum)
Spoke with Daughter to discuss current POC and patient status.  She expressed concerns for needing a referral to Pulmonology at [facility] - to assist with COPD management. No further questions at this time.

## 2019-01-10 NOTE — Discharge Instructions (Signed)
Follow with Beckie Salts, MD in 5-7 days  Please get a complete blood count and chemistry panel checked by your Primary MD at your next visit, and again as instructed by your Primary MD. Please get your medications reviewed and adjusted by your Primary MD.  Please request your Primary MD to go over all Hospital Tests and Procedure/Radiological results at the follow up, please get all Hospital records sent to your Prim MD by signing hospital release before you go home.  In some cases, there will be blood work, cultures and biopsy results pending at the time of your discharge. Please request that your primary care M.D. goes through all the records of your hospital data and follows up on these results.  If you had Pneumonia of Lung problems at the Hospital: Please get a 2 view Chest X ray done in 6-8 weeks after hospital discharge or sooner if instructed by your Primary MD.  If you have Congestive Heart Failure: Please call your Cardiologist or Primary MD anytime you have any of the following symptoms:  1) 3 pound weight gain in 24 hours or 5 pounds in 1 week  2) shortness of breath, with or without a dry hacking cough  3) swelling in the hands, feet or stomach  4) if you have to sleep on extra pillows at night in order to breathe  Follow cardiac low salt diet and 1.5 lit/day fluid restriction.  If you have diabetes Accuchecks 4 times/day, Once in AM empty stomach and then before each meal. Log in all results and show them to your primary doctor at your next visit. If any glucose reading is under 80 or above 300 call your primary MD immediately.  If you have Seizure/Convulsions/Epilepsy: Please do not drive, operate heavy machinery, participate in activities at heights or participate in high speed sports until you have seen by Primary MD or a Neurologist and advised to do so again. Per [facility] statutes, patients with seizures are not allowed to drive until they have been  seizure-free for six months.  Use caution when using heavy equipment or power tools. Avoid working on ladders or at heights. Take showers instead of baths. Ensure the water temperature is not too high on the home water heater. Do not go swimming alone. Do not lock yourself in a room alone (i.e. bathroom). When caring for infants or small children, sit down when holding, feeding, or changing them to minimize risk of injury to the child in the event you have a seizure. Maintain good sleep hygiene. Avoid alcohol.   If you had Gastrointestinal Bleeding: Please ask your Primary MD to check a complete blood count within one week of discharge or at your next visit. Your endoscopic/colonoscopic biopsies that are pending at the time of discharge, will also need to followed by your Primary MD.  Get Medicines reviewed and adjusted. Please take all your medications with you for your next visit with your Primary MD  Please request your Primary MD to go over all hospital tests and procedure/radiological results at the follow up, please ask your Primary MD to get all Hospital records sent to his/her office.  If you experience worsening of your admission symptoms, develop shortness of breath, life threatening emergency, suicidal or homicidal thoughts you must seek medical attention immediately by calling 911 or calling your MD immediately  if symptoms less severe.  You must read complete instructions/literature along with all the possible adverse reactions/side effects for all the Medicines you take  and that have been prescribed to you. Take any new Medicines after you have completely understood and accpet all the possible adverse reactions/side effects.   Do not drive or operate heavy machinery when taking Pain medications.   Do not take more than prescribed Pain, Sleep and Anxiety Medications  Special Instructions: If you have smoked or chewed Tobacco  in the last 2 yrs please stop smoking, stop any regular  Alcohol  and or any Recreational drug use.  Wear Seat belts while driving.  Please note You were cared for by a hospitalist during your hospital stay. If you have any questions about your discharge medications or the care you received while you were in the hospital after you are discharged, you can call the unit and asked to speak with the hospitalist on call if the hospitalist that took care of you is not available. Once you are discharged, your primary care physician will handle any further medical issues. Please note that NO REFILLS for any discharge medications will be authorized once you are discharged, as it is imperative that you return to your primary care physician (or establish a relationship with a primary care physician if you do not have one) for your aftercare needs so that they can reassess your need for medications and monitor your lab values.  You can reach the hospitalist office at phone [phone] or fax [phone]   If you do not have a primary care physician, you can call [phone] for a physician referral.  Activity: As tolerated with Full fall precautions use [place]/cane & assistance as needed    Diet: regular  Disposition Home

## 2019-01-10 NOTE — Discharge Planning (Addendum)
Removed IV. RN assessment and vS revealed stability for DC to home with Husband.  Discussed need to quarantine additional 2 weeks beyond DC.  Continue monitoring s/sx of self and anyone else living in the home for additional increased difficulties with breathing or weakness d/t covid.  Signed DC contract and placed on chart. Asked to contact PCP or return to the ED if needed.  Referred to Pulmonologist for FU to assist with COPD management.  Script printed and given to patient for prednisone taper.   Once ready, will be wheeled to front and Husband P/U and transporting home via car.

## 2019-01-10 NOTE — Plan of Care (Signed)
  Problem: Education: Goal: Knowledge of risk factors and measures for prevention of condition will improve Outcome: Progressing   Problem: Coping: Goal: Psychosocial and spiritual needs will be supported Outcome: Progressing   Problem: Respiratory: Goal: Will maintain a patent airway Outcome: Progressing Goal: Complications related to the disease process, condition or treatment will be avoided or minimized Outcome: Progressing   

## 2019-01-10 NOTE — Progress Notes (Signed)
No chest pain, hypoxia, or increasing O2 demand overnight.  This RN not concerned about acute PE.

## 2019-01-25 DIAGNOSIS — G4733 Obstructive sleep apnea (adult) (pediatric): Secondary | ICD-10-CM | POA: Diagnosis not present

## 2019-01-25 DIAGNOSIS — R0602 Shortness of breath: Secondary | ICD-10-CM | POA: Diagnosis not present

## 2019-01-25 DIAGNOSIS — R918 Other nonspecific abnormal finding of lung field: Secondary | ICD-10-CM | POA: Diagnosis not present

## 2019-01-31 DIAGNOSIS — F5104 Psychophysiologic insomnia: Secondary | ICD-10-CM | POA: Diagnosis not present

## 2019-01-31 DIAGNOSIS — M159 Polyosteoarthritis, unspecified: Secondary | ICD-10-CM | POA: Diagnosis not present

## 2019-01-31 DIAGNOSIS — E7800 Pure hypercholesterolemia, unspecified: Secondary | ICD-10-CM | POA: Diagnosis not present

## 2019-01-31 DIAGNOSIS — J189 Pneumonia, unspecified organism: Secondary | ICD-10-CM | POA: Diagnosis not present

## 2019-01-31 DIAGNOSIS — F329 Major depressive disorder, single episode, unspecified: Secondary | ICD-10-CM | POA: Diagnosis not present

## 2019-01-31 DIAGNOSIS — J449 Chronic obstructive pulmonary disease, unspecified: Secondary | ICD-10-CM | POA: Diagnosis not present

## 2019-01-31 DIAGNOSIS — I5022 Chronic systolic (congestive) heart failure: Secondary | ICD-10-CM | POA: Diagnosis not present

## 2019-01-31 DIAGNOSIS — I110 Hypertensive heart disease with heart failure: Secondary | ICD-10-CM | POA: Diagnosis not present

## 2019-01-31 DIAGNOSIS — M549 Dorsalgia, unspecified: Secondary | ICD-10-CM | POA: Diagnosis not present

## 2019-01-31 DIAGNOSIS — E039 Hypothyroidism, unspecified: Secondary | ICD-10-CM | POA: Diagnosis not present

## 2019-01-31 DIAGNOSIS — U071 COVID-19: Secondary | ICD-10-CM | POA: Diagnosis not present

## 2019-01-31 DIAGNOSIS — K219 Gastro-esophageal reflux disease without esophagitis: Secondary | ICD-10-CM | POA: Diagnosis not present

## 2019-02-02 DIAGNOSIS — R600 Localized edema: Secondary | ICD-10-CM | POA: Diagnosis not present

## 2019-02-02 DIAGNOSIS — R0602 Shortness of breath: Secondary | ICD-10-CM | POA: Diagnosis not present

## 2019-02-02 DIAGNOSIS — R05 Cough: Secondary | ICD-10-CM | POA: Diagnosis not present

## 2019-02-02 DIAGNOSIS — J8410 Pulmonary fibrosis, unspecified: Secondary | ICD-10-CM | POA: Diagnosis not present

## 2019-02-02 DIAGNOSIS — I700 Atherosclerosis of aorta: Secondary | ICD-10-CM | POA: Diagnosis not present

## 2019-02-03 ENCOUNTER — Encounter (HOSPITAL_COMMUNITY): Payer: Self-pay | Admitting: *Deleted

## 2019-02-03 ENCOUNTER — Emergency Department (HOSPITAL_COMMUNITY): Payer: PPO

## 2019-02-03 ENCOUNTER — Emergency Department (HOSPITAL_COMMUNITY)
Admission: EM | Admit: 2019-02-03 | Discharge: 2019-02-03 | Disposition: A | Discharge status: Home / Self Care | Payer: PPO | Attending: Emergency Medicine | Admitting: Emergency Medicine

## 2019-02-03 ENCOUNTER — Other Ambulatory Visit: Payer: Self-pay

## 2019-02-03 DIAGNOSIS — R0602 Shortness of breath: Secondary | ICD-10-CM | POA: Diagnosis not present

## 2019-02-03 DIAGNOSIS — J441 Chronic obstructive pulmonary disease with (acute) exacerbation: Secondary | ICD-10-CM | POA: Insufficient documentation

## 2019-02-03 DIAGNOSIS — R062 Wheezing: Secondary | ICD-10-CM | POA: Diagnosis not present

## 2019-02-03 DIAGNOSIS — Z79899 Other long term (current) drug therapy: Secondary | ICD-10-CM | POA: Insufficient documentation

## 2019-02-03 DIAGNOSIS — I5022 Chronic systolic (congestive) heart failure: Secondary | ICD-10-CM | POA: Insufficient documentation

## 2019-02-03 DIAGNOSIS — E039 Hypothyroidism, unspecified: Secondary | ICD-10-CM | POA: Diagnosis not present

## 2019-02-03 DIAGNOSIS — I110 Hypertensive heart disease with heart failure: Secondary | ICD-10-CM | POA: Insufficient documentation

## 2019-02-03 DIAGNOSIS — I10 Essential (primary) hypertension: Secondary | ICD-10-CM | POA: Diagnosis not present

## 2019-02-03 DIAGNOSIS — R05 Cough: Secondary | ICD-10-CM | POA: Diagnosis not present

## 2019-02-03 LAB — CBC WITH DIFFERENTIAL/PLATELET
Abs Immature Granulocytes: 0.02 10*3/uL (ref 0.00–0.07)
Basophils Absolute: 0 10*3/uL (ref 0.0–0.1)
Basophils Relative: 0 %
Eosinophils Absolute: 0.4 10*3/uL (ref 0.0–0.5)
Eosinophils Relative: 6 %
HCT: 35.7 % — ABNORMAL LOW (ref 36.0–46.0)
Hemoglobin: 11.8 g/dL — ABNORMAL LOW (ref 12.0–15.0)
Immature Granulocytes: 0 %
Lymphocytes Relative: 21 %
Lymphs Abs: 1.4 10*3/uL (ref 0.7–4.0)
MCH: 31.4 pg (ref 26.0–34.0)
MCHC: 33.1 g/dL (ref 30.0–36.0)
MCV: 94.9 fL (ref 80.0–100.0)
Monocytes Absolute: 0.6 10*3/uL (ref 0.1–1.0)
Monocytes Relative: 9 %
Neutro Abs: 4.2 10*3/uL (ref 1.7–7.7)
Neutrophils Relative %: 64 %
Platelets: 230 10*3/uL (ref 150–400)
RBC: 3.76 MIL/uL — ABNORMAL LOW (ref 3.87–5.11)
RDW: 13.8 % (ref 11.5–15.5)
WBC: 6.6 10*3/uL (ref 4.0–10.5)
nRBC: 0 % (ref 0.0–0.2)

## 2019-02-03 LAB — BASIC METABOLIC PANEL
Anion gap: 14 (ref 5–15)
BUN: 11 mg/dL (ref 8–23)
CO2: 32 mmol/L (ref 22–32)
Calcium: 9.3 mg/dL (ref 8.9–10.3)
Chloride: 94 mmol/L — ABNORMAL LOW (ref 98–111)
Creatinine, Ser: 0.9 mg/dL (ref 0.44–1.00)
GFR calc Af Amer: >60 mL/min (ref >60)
GFR calc non Af Amer: >60 mL/min (ref >60)
Glucose, Bld: 132 mg/dL — ABNORMAL HIGH (ref 70–99)
Potassium: 3.3 mmol/L — ABNORMAL LOW (ref 3.5–5.1)
Sodium: 140 mmol/L (ref 135–145)

## 2019-02-03 LAB — TROPONIN I (HIGH SENSITIVITY)
Troponin I (High Sensitivity): 6 ng/L (ref <18)
Troponin I (High Sensitivity): 6 ng/L (ref <18)

## 2019-02-03 LAB — BRAIN NATRIURETIC PEPTIDE: B Natriuretic Peptide: 17.7 pg/mL (ref 0.0–100.0)

## 2019-02-03 LAB — LACTIC ACID, PLASMA: Lactic Acid, Venous: 2.1 mmol/L (ref 0.5–1.9)

## 2019-02-03 MED ORDER — AEROCHAMBER PLUS FLO-VU LARGE MISC
Status: AC
  Filled 2019-02-03: qty 1

## 2019-02-03 MED ORDER — POTASSIUM CHLORIDE CRYS ER 20 MEQ PO TBCR
20.0000 meq | EXTENDED_RELEASE_TABLET | Freq: Once | ORAL | Status: AC
  Administered 2019-02-03: 20 meq via ORAL
  Filled 2019-02-03: qty 1

## 2019-02-03 MED ORDER — ALBUTEROL SULFATE HFA 108 (90 BASE) MCG/ACT IN AERS
2.0000 | INHALATION_SPRAY | Freq: Once | RESPIRATORY_TRACT | Status: AC
  Administered 2019-02-03: 14:00:00 2 via RESPIRATORY_TRACT
  Filled 2019-02-03: qty 6.7

## 2019-02-03 NOTE — ED Notes (Addendum)
Pt ambulated on RA and O2 hung around 88-89%. Pt stated that she normally only wears o2 at night.

## 2019-02-03 NOTE — ED Notes (Signed)
Pt given dc paperwork. Pt verbalizes understanding.

## 2019-02-03 NOTE — ED Notes (Signed)
Date and time results received: 02/03/19 1440 (use smartphrase ".now" to insert current time)  Test: lactic acid Critical Value:2.1  Name of Provider Notified: Melina Copa, MD  Orders Received? Or Actions Taken?: .

## 2019-02-03 NOTE — Discharge Instructions (Addendum)
You were seen in the emergency department for continued cough and shortness of breath.  Your oxygen level was low here and improved with oxygen.  You should continue to use your oxygen during the day as well as at night.  Please finish your antibiotics and steroids.  Call your pulmonologist and primary care doctor for close follow-up.  Please return to the emergency department if any worsening symptoms.  We have ordered the home health team to come and speak with you about providing more oxygen tank as needed.

## 2019-02-03 NOTE — ED Triage Notes (Signed)
Patient presents to ed via Wendy Cooke ems states she was seen by her pulm. Doctor yest had chest xray. C/o lower ext swelling for several days states she only  Normally uses o2 at night however the past several days has had to use o2 all the time. 2liters. Patient states she was called today and told her chest xray showed fluid on her lungs and if she was more sob to come to the ED. Denies pain. Patient used neb at home and was given 1/2 duo neb per ems. States she had covid positive in July. Was given Solu- Medrol 125 mg per ems. States she is suppose to follow with a doctor 11/4 to test for COPD.

## 2019-02-03 NOTE — ED Provider Notes (Signed)
[facility] EMERGENCY DEPARTMENT Provider Note   CSN: [id number] Arrival date & time: 02/03/19  1334     History   Chief Complaint Chief Complaint  Patient presents with  . Shortness of Breath    HPI Wendy Cooke is a 78 y.o. female.  She has a history of COPD CHF and has been hospitalized 3 times in the last few months for respiratory issues and Covid.  She was last tested for [facility] October 3 and was positive.  She is complaining of continued cough and shortness of breath.  She saw her pulmonologist yesterday and was put on antibiotics and increased her diuretics but she felt worse today.  No fevers no chest pain no abdominal pain.  Positive leg edema improved having been on diuretics.     The history is provided by the patient.  Shortness of Breath Severity:  Moderate Onset quality:  Gradual Timing:  Intermittent Progression:  Worsening Chronicity:  Recurrent Context: activity   Relieved by:  Nothing Worsened by:  Activity Ineffective treatments:  Diuretics and inhaler Associated symptoms: cough and wheezing   Associated symptoms: no abdominal pain, no chest pain, no fever, no headaches, no hemoptysis, no neck pain, no rash, no sore throat, no sputum production and no vomiting     Past Medical History:  Diagnosis Date  . Asthma   . CHF (congestive heart failure) ([place])   . COPD (chronic obstructive pulmonary disease) ([place])   . GERD (gastroesophageal reflux disease)   . Hypertension   . Hypothyroidism     Patient Active Problem List   Diagnosis Date Noted  . Acute respiratory failure with hypoxia ([place]) 01/09/2019  . COVID-19 virus infection 01/09/2019  . COPD with acute exacerbation ([place]) 01/09/2019  . AKI (acute kidney injury) ([place]) 01/09/2019  . Chronic systolic CHF (congestive heart failure) ([place]) 01/09/2019  . Essential hypertension 01/09/2019  . Normocytic anemia 01/09/2019  . Hypothyroidism 01/09/2019  . Obesity (BMI 30.0-34.9)  01/09/2019  . OSA (obstructive sleep apnea) 01/09/2019  . Pressure injury of skin 01/09/2019    Past Surgical History:  Procedure Laterality Date  . ABDOMINAL HYSTERECTOMY    . JOINT REPLACEMENT Bilateral    knees     OB History   No obstetric history on file.      Home Medications    Prior to Admission medications   Medication Sig Start Date End Date Taking? Authorizing Provider  albuterol (PROVENTIL) (2.5 MG/3ML) 0.083% nebulizer solution Inhale 2.5 mg into the lungs every 4 (four) hours as needed for wheezing.    [provider]  albuterol (VENTOLIN HFA) 108 (90 Base) MCG/ACT inhaler Inhale 2 puffs into the lungs every 6 (six) hours as needed for wheezing. 11/28/18   [provider]  benazepril (LOTENSIN) 20 MG tablet Take 20 mg by mouth daily. 11/18/18   [provider]  benzonatate (TESSALON) 200 MG capsule Take 200 mg by mouth 3 (three) times daily as needed for cough.    [provider]  carvedilol (COREG) 6.25 MG tablet Take 6.25 mg by mouth 2 (two) times daily. 06/11/18   [provider]  citalopram (CELEXA) 20 MG tablet Take 20 mg by mouth daily. 06/11/18   [provider]  clonazePAM (KLONOPIN) 0.5 MG tablet Take 1 tablet by mouth 3 (three) times daily as needed for anxiety. 11/18/18   [provider]  dicyclomine (BENTYL) 10 MG capsule Take 10 mg by mouth 4 (four) times daily as needed. 06/11/18  [provider]  hydrochlorothiazide (HYDRODIURIL) 12.5 MG tablet Take 12.5 mg by mouth daily. 11/18/18   [provider]  HYDROcodone-acetaminophen (NORCO) 7.5-325 MG tablet Take 1 tablet by mouth 3 (three) times daily as needed for pain. 12/27/18   [provider]  levothyroxine (SYNTHROID) 50 MCG tablet Take 50 mcg by mouth daily. 09/17/18   [provider]  montelukast (SINGULAIR) 10 MG tablet Take 10 mg by mouth at bedtime. 07/16/18   [provider]  predniSONE (DELTASONE) 10 MG  tablet Take 4 tablets (40 mg total) by mouth daily. Take 2 tablets (40 mg) for 3 days then 1.5 tablets (30 mg for 3 days) then 1 tablet (20 mg) for 3 days then stop 01/10/19   Caren Griffins, MD  rOPINIRole (REQUIP) 0.5 MG tablet Take 1 tablet by mouth 3 (three) times daily. 11/18/18   [provider]  simvastatin (ZOCOR) 20 MG tablet Take 20 mg by mouth at bedtime. 06/11/18   [provider]  traZODone (DESYREL) 50 MG tablet Take 50 mg by mouth at bedtime. 12/10/18   [provider]    Family History Family History  Problem Relation Age of Onset  . Cancer Mother   . Cancer Father   . Diabetes Sister     Social History Social History   Tobacco Use  . Smoking status: Never Smoker  . Smokeless tobacco: Never Used  Substance Use Topics  . Alcohol use: Never    Frequency: Never  . Drug use: Never     Allergies   Patient has no known allergies.   Review of Systems Review of Systems  Constitutional: Negative for fever.  HENT: Negative for sore throat.   Eyes: Negative for visual disturbance.  Respiratory: Positive for cough, shortness of breath and wheezing. Negative for hemoptysis and sputum production.   Cardiovascular: Negative for chest pain.  Gastrointestinal: Negative for abdominal pain and vomiting.  Genitourinary: Negative for dysuria.  Musculoskeletal: Negative for neck pain.  Skin: Negative for rash.  Neurological: Negative for headaches.     Physical Exam Updated Vital Signs BP 138/82 (BP Location: Right Arm)   Pulse 89   Temp 99 F (37.2 C) (Oral)   Resp 18   Ht 5' 6.5" (1.689 m)   Wt 90.3 kg   SpO2 97%   BMI 31.64 kg/m   Physical Exam Vitals signs and nursing note reviewed.  Constitutional:      General: She is not in acute distress.    Appearance: She is well-developed.  HENT:     Head: Normocephalic and atraumatic.  Eyes:     Conjunctiva/sclera: Conjunctivae normal.  Neck:     Musculoskeletal: Neck supple.   Cardiovascular:     Rate and Rhythm: Normal rate and regular rhythm.     Heart sounds: No murmur.  Pulmonary:     Effort: Pulmonary effort is normal. No respiratory distress.     Breath sounds: Wheezing and rhonchi present.  Abdominal:     Palpations: Abdomen is soft.     Tenderness: There is no abdominal tenderness.  Musculoskeletal:        General: No deformity or signs of injury.     Right lower leg: Edema present.     Left lower leg: Edema present.  Skin:    General: Skin is warm and dry.     Capillary Refill: Capillary refill takes less than 2 seconds.  Neurological:     General: No focal deficit present.  Mental Status: She is alert.      ED Treatments / Results  Labs (all labs ordered are listed, but only abnormal results are displayed) Labs Reviewed  BASIC METABOLIC PANEL - Abnormal; Notable for the following components:      Result Value   Potassium 3.3 (*)    Chloride 94 (*)    Glucose, Bld 132 (*)    All other components within normal limits  CBC WITH DIFFERENTIAL/PLATELET - Abnormal; Notable for the following components:   RBC 3.76 (*)    Hemoglobin 11.8 (*)    HCT 35.7 (*)    All other components within normal limits  LACTIC ACID, PLASMA - Abnormal; Notable for the following components:   Lactic Acid, Venous 2.1 (*)    All other components within normal limits  BRAIN NATRIURETIC PEPTIDE  TROPONIN I (HIGH SENSITIVITY)  TROPONIN I (HIGH SENSITIVITY)    EKG EKG Interpretation  Date/Time:  Thursday February 03 2019 13:34:59 EDT Ventricular Rate:  88 PR Interval:    QRS Duration: 84 QT Interval:  385 QTC Calculation: 466 R Axis:   66 Text Interpretation: Sinus rhythm Atrial premature complex Borderline abnrm T, anterolateral leads new anterior changes compared with prior 10/20 Confirmed by Aletta Edouard [phone]) on 02/03/2019 2:01:55 PM   Radiology Dg Chest Port 1 View  Result Date: 02/03/2019 CLINICAL DATA:  Shortness of breath and swelling  EXAM: PORTABLE CHEST 1 VIEW COMPARISON:  Radiograph 02/02/2019, CT 01/10/2019 FINDINGS: Low volumes and atelectasis. No confluent opacity or convincing features of edema. No pneumothorax or effusion. Stable pleural thickening in the left lung base corresponding to a subpleural fat pad seen on comparison CT. Few calcified granulomata. The aorta is calcified. The remaining cardiomediastinal contours are unremarkable. IMPRESSION: Low lung volumes and atelectasis. No acute cardiopulmonary abnormality. Aortic Atherosclerosis (ICD10-I70.0). Electronically Signed   By: Lovena Le M.D.   On: 02/03/2019 14:59    Procedures Procedures (including critical care time)  Medications Ordered in ED Medications  AeroChamber Plus Flo-Vu Large MISC (has no administration in time range)  albuterol (VENTOLIN HFA) 108 (90 Base) MCG/ACT inhaler 2 puff (2 puffs Inhalation Given 02/03/19 1421)  potassium chloride SA (KLOR-CON) CR tablet 20 mEq (20 mEq Oral Given 02/03/19 1530)     Initial Impression / Assessment and Plan / ED Course  I have reviewed the triage vital signs and the nursing notes.  Pertinent labs & imaging results that were available during my care of the patient were reviewed by me and considered in my medical decision making (see chart for details).  Clinical Course as of Feb 02 1838  Thu Feb 03, 2019  1400 Differential diagnosis includes Covid, pneumonia, CHF, COPD exacerbation.  Getting chest x-ray labs EKG.   [MB]  1440 Patient's lactate elevated at 2.1.  I do not think this is sepsis and she seems fluid positive so we will hold off on any fluids for now.   [MB]  [id number] Chest x-ray interpreted by me no gross infiltrates or CHF.   [MB]  [id number] Patient's labs and chest x-ray are fairly unremarkable.  We tried a trending pulse ox off of oxygen and she dropped into the high 80s.  She usually uses oxygen only at night.  We talked about trying to get her to use it through the day 2 until she fully gets  over this respiratory problem.  She is comfortable with plan for discharge and close follow-up.   [MB]    Clinical Course User Index [  MB] Hayden Rasmussen, MD   Tama Gander was evaluated in Emergency Department on 02/03/2019 for the symptoms described in the history of present illness. She was evaluated in the context of the global COVID-19 pandemic, which necessitated consideration that the patient might be at risk for infection with the SARS-CoV-2 virus that causes COVID-19. Institutional protocols and algorithms that pertain to the evaluation of patients at risk for COVID-19 are in a state of rapid change based on information released by regulatory bodies including the CDC and federal and state organizations. These policies and algorithms were followed during the patient's care in the ED.      Final Clinical Impressions(s) / ED Diagnoses   Final diagnoses:  Shortness of breath  COPD exacerbation [facility])    ED Discharge Orders    None       Hayden Rasmussen, MD 02/03/19 [id number]

## 2019-02-03 NOTE — Care Management (Signed)
Patient is being discharge on continuous home oxygen, patient is already on home oxygen prn. Patient confirms she has an oxygen concentrator at home with a portable tank in which her husband will bring to the ED to pick her up.  CM contacted and faxed new orders to [facility]  (formerly [place]), CM attempted to contact [facility] after hours number LVM will follow up tomorrow  Laurena Slimmer RN, BSN  ED Care Manager [phone]

## 2019-02-04 NOTE — Progress Notes (Signed)
SATURATION QUALIFICATIONS: (This note is used to comply with regulatory documentation for home oxygen)  Patient Saturations on Room Air at Rest = 88%  Patient Saturations on Room Air while Ambulating = 88 %  Patient Saturations on 2 Liters of oxygen while Ambulating = 94%  Please briefly explain why patient needs home oxygen: per Ethelle Lyon RN COPD, COVID 19 -Positive 01/09/2019

## 2019-02-08 DIAGNOSIS — U071 COVID-19: Secondary | ICD-10-CM | POA: Diagnosis not present

## 2019-02-10 DIAGNOSIS — J449 Chronic obstructive pulmonary disease, unspecified: Secondary | ICD-10-CM | POA: Diagnosis not present

## 2019-02-15 DIAGNOSIS — I214 Non-ST elevation (NSTEMI) myocardial infarction: Secondary | ICD-10-CM | POA: Diagnosis not present

## 2019-02-15 DIAGNOSIS — R0602 Shortness of breath: Secondary | ICD-10-CM | POA: Diagnosis not present

## 2019-02-15 DIAGNOSIS — I429 Cardiomyopathy, unspecified: Secondary | ICD-10-CM | POA: Diagnosis not present

## 2019-02-15 DIAGNOSIS — I10 Essential (primary) hypertension: Secondary | ICD-10-CM | POA: Diagnosis not present

## 2019-02-15 DIAGNOSIS — R05 Cough: Secondary | ICD-10-CM | POA: Diagnosis not present

## 2019-02-15 DIAGNOSIS — E7800 Pure hypercholesterolemia, unspecified: Secondary | ICD-10-CM | POA: Diagnosis not present

## 2019-02-16 DIAGNOSIS — J449 Chronic obstructive pulmonary disease, unspecified: Secondary | ICD-10-CM | POA: Diagnosis not present

## 2019-02-16 DIAGNOSIS — R918 Other nonspecific abnormal finding of lung field: Secondary | ICD-10-CM | POA: Diagnosis not present

## 2019-02-16 DIAGNOSIS — J984 Other disorders of lung: Secondary | ICD-10-CM | POA: Diagnosis not present

## 2019-03-07 DIAGNOSIS — R918 Other nonspecific abnormal finding of lung field: Secondary | ICD-10-CM | POA: Diagnosis not present

## 2019-03-07 DIAGNOSIS — J181 Lobar pneumonia, unspecified organism: Secondary | ICD-10-CM | POA: Diagnosis not present

## 2019-03-10 DIAGNOSIS — U071 COVID-19: Secondary | ICD-10-CM | POA: Diagnosis not present

## 2019-03-11 DIAGNOSIS — I340 Nonrheumatic mitral (valve) insufficiency: Secondary | ICD-10-CM | POA: Diagnosis not present

## 2019-03-14 DIAGNOSIS — E7800 Pure hypercholesterolemia, unspecified: Secondary | ICD-10-CM | POA: Diagnosis not present

## 2019-03-14 DIAGNOSIS — R739 Hyperglycemia, unspecified: Secondary | ICD-10-CM | POA: Diagnosis not present

## 2019-03-14 DIAGNOSIS — E039 Hypothyroidism, unspecified: Secondary | ICD-10-CM | POA: Diagnosis not present

## 2019-03-14 DIAGNOSIS — Z1159 Encounter for screening for other viral diseases: Secondary | ICD-10-CM | POA: Diagnosis not present

## 2019-03-14 DIAGNOSIS — I10 Essential (primary) hypertension: Secondary | ICD-10-CM | POA: Diagnosis not present

## 2019-03-15 DIAGNOSIS — J42 Unspecified chronic bronchitis: Secondary | ICD-10-CM | POA: Diagnosis not present

## 2019-03-18 DIAGNOSIS — J181 Lobar pneumonia, unspecified organism: Secondary | ICD-10-CM | POA: Diagnosis not present

## 2019-03-18 DIAGNOSIS — R918 Other nonspecific abnormal finding of lung field: Secondary | ICD-10-CM | POA: Diagnosis not present

## 2019-03-18 DIAGNOSIS — J449 Chronic obstructive pulmonary disease, unspecified: Secondary | ICD-10-CM | POA: Diagnosis not present

## 2019-03-21 DIAGNOSIS — U071 COVID-19: Secondary | ICD-10-CM | POA: Diagnosis not present

## 2019-03-21 DIAGNOSIS — E7800 Pure hypercholesterolemia, unspecified: Secondary | ICD-10-CM | POA: Diagnosis not present

## 2019-03-21 DIAGNOSIS — I7300 Raynaud's syndrome without gangrene: Secondary | ICD-10-CM | POA: Diagnosis not present

## 2019-03-21 DIAGNOSIS — G2581 Restless legs syndrome: Secondary | ICD-10-CM | POA: Diagnosis not present

## 2019-03-21 DIAGNOSIS — D649 Anemia, unspecified: Secondary | ICD-10-CM | POA: Diagnosis not present

## 2019-03-21 DIAGNOSIS — I110 Hypertensive heart disease with heart failure: Secondary | ICD-10-CM | POA: Diagnosis not present

## 2019-03-21 DIAGNOSIS — I5022 Chronic systolic (congestive) heart failure: Secondary | ICD-10-CM | POA: Diagnosis not present

## 2019-03-21 DIAGNOSIS — K219 Gastro-esophageal reflux disease without esophagitis: Secondary | ICD-10-CM | POA: Diagnosis not present

## 2019-03-21 DIAGNOSIS — M159 Polyosteoarthritis, unspecified: Secondary | ICD-10-CM | POA: Diagnosis not present

## 2019-03-21 DIAGNOSIS — Z0000 Encounter for general adult medical examination without abnormal findings: Secondary | ICD-10-CM | POA: Diagnosis not present

## 2019-03-21 DIAGNOSIS — F329 Major depressive disorder, single episode, unspecified: Secondary | ICD-10-CM | POA: Diagnosis not present

## 2019-03-21 DIAGNOSIS — K580 Irritable bowel syndrome with diarrhea: Secondary | ICD-10-CM | POA: Diagnosis not present

## 2019-03-21 DIAGNOSIS — E039 Hypothyroidism, unspecified: Secondary | ICD-10-CM | POA: Diagnosis not present

## 2019-04-04 DIAGNOSIS — R05 Cough: Secondary | ICD-10-CM | POA: Diagnosis not present

## 2019-04-10 DIAGNOSIS — U071 COVID-19: Secondary | ICD-10-CM | POA: Diagnosis not present

## 2019-04-15 DIAGNOSIS — J42 Unspecified chronic bronchitis: Secondary | ICD-10-CM | POA: Diagnosis not present

## 2019-05-03 DIAGNOSIS — I214 Non-ST elevation (NSTEMI) myocardial infarction: Secondary | ICD-10-CM | POA: Diagnosis not present

## 2019-05-03 DIAGNOSIS — I10 Essential (primary) hypertension: Secondary | ICD-10-CM | POA: Diagnosis not present

## 2019-05-03 DIAGNOSIS — E7800 Pure hypercholesterolemia, unspecified: Secondary | ICD-10-CM | POA: Diagnosis not present

## 2019-05-03 DIAGNOSIS — I429 Cardiomyopathy, unspecified: Secondary | ICD-10-CM | POA: Diagnosis not present

## 2019-05-03 DIAGNOSIS — E039 Hypothyroidism, unspecified: Secondary | ICD-10-CM | POA: Diagnosis not present

## 2019-05-11 DIAGNOSIS — U071 COVID-19: Secondary | ICD-10-CM | POA: Diagnosis not present

## 2019-05-16 DIAGNOSIS — J42 Unspecified chronic bronchitis: Secondary | ICD-10-CM | POA: Diagnosis not present

## 2019-05-17 DIAGNOSIS — I214 Non-ST elevation (NSTEMI) myocardial infarction: Secondary | ICD-10-CM | POA: Diagnosis not present

## 2019-05-17 DIAGNOSIS — I10 Essential (primary) hypertension: Secondary | ICD-10-CM | POA: Diagnosis not present

## 2019-06-08 DIAGNOSIS — U071 COVID-19: Secondary | ICD-10-CM | POA: Diagnosis not present

## 2019-06-13 DIAGNOSIS — J42 Unspecified chronic bronchitis: Secondary | ICD-10-CM | POA: Diagnosis not present

## 2019-06-16 DIAGNOSIS — R918 Other nonspecific abnormal finding of lung field: Secondary | ICD-10-CM | POA: Diagnosis not present

## 2019-06-16 DIAGNOSIS — J181 Lobar pneumonia, unspecified organism: Secondary | ICD-10-CM | POA: Diagnosis not present

## 2019-06-20 DIAGNOSIS — J449 Chronic obstructive pulmonary disease, unspecified: Secondary | ICD-10-CM | POA: Diagnosis not present

## 2019-06-20 DIAGNOSIS — E669 Obesity, unspecified: Secondary | ICD-10-CM | POA: Diagnosis not present

## 2019-06-20 DIAGNOSIS — R918 Other nonspecific abnormal finding of lung field: Secondary | ICD-10-CM | POA: Diagnosis not present

## 2019-06-22 DIAGNOSIS — K580 Irritable bowel syndrome with diarrhea: Secondary | ICD-10-CM | POA: Diagnosis not present

## 2019-06-22 DIAGNOSIS — K219 Gastro-esophageal reflux disease without esophagitis: Secondary | ICD-10-CM | POA: Diagnosis not present

## 2019-06-22 DIAGNOSIS — I5022 Chronic systolic (congestive) heart failure: Secondary | ICD-10-CM | POA: Diagnosis not present

## 2019-06-22 DIAGNOSIS — Z8616 Personal history of COVID-19: Secondary | ICD-10-CM | POA: Diagnosis not present

## 2019-06-22 DIAGNOSIS — Z6832 Body mass index (BMI) 32.0-32.9, adult: Secondary | ICD-10-CM | POA: Diagnosis not present

## 2019-06-22 DIAGNOSIS — I110 Hypertensive heart disease with heart failure: Secondary | ICD-10-CM | POA: Diagnosis not present

## 2019-06-22 DIAGNOSIS — F411 Generalized anxiety disorder: Secondary | ICD-10-CM | POA: Diagnosis not present

## 2019-06-22 DIAGNOSIS — J449 Chronic obstructive pulmonary disease, unspecified: Secondary | ICD-10-CM | POA: Diagnosis not present

## 2019-06-22 DIAGNOSIS — F329 Major depressive disorder, single episode, unspecified: Secondary | ICD-10-CM | POA: Diagnosis not present

## 2019-06-22 DIAGNOSIS — E6609 Other obesity due to excess calories: Secondary | ICD-10-CM | POA: Diagnosis not present

## 2019-06-22 DIAGNOSIS — E039 Hypothyroidism, unspecified: Secondary | ICD-10-CM | POA: Diagnosis not present

## 2019-06-22 DIAGNOSIS — E7800 Pure hypercholesterolemia, unspecified: Secondary | ICD-10-CM | POA: Diagnosis not present

## 2019-07-09 DIAGNOSIS — U071 COVID-19: Secondary | ICD-10-CM | POA: Diagnosis not present

## 2019-07-14 DIAGNOSIS — J42 Unspecified chronic bronchitis: Secondary | ICD-10-CM | POA: Diagnosis not present

## 2019-07-18 DIAGNOSIS — E669 Obesity, unspecified: Secondary | ICD-10-CM | POA: Diagnosis not present

## 2019-07-18 DIAGNOSIS — J449 Chronic obstructive pulmonary disease, unspecified: Secondary | ICD-10-CM | POA: Diagnosis not present

## 2019-07-18 DIAGNOSIS — I5022 Chronic systolic (congestive) heart failure: Secondary | ICD-10-CM | POA: Diagnosis not present

## 2019-08-01 DIAGNOSIS — J441 Chronic obstructive pulmonary disease with (acute) exacerbation: Secondary | ICD-10-CM | POA: Diagnosis not present

## 2019-08-01 DIAGNOSIS — J449 Chronic obstructive pulmonary disease, unspecified: Secondary | ICD-10-CM | POA: Diagnosis not present

## 2019-08-08 DIAGNOSIS — U071 COVID-19: Secondary | ICD-10-CM | POA: Diagnosis not present

## 2019-08-13 DIAGNOSIS — J42 Unspecified chronic bronchitis: Secondary | ICD-10-CM | POA: Diagnosis not present

## 2019-09-06 DIAGNOSIS — N393 Stress incontinence (female) (male): Secondary | ICD-10-CM | POA: Diagnosis not present

## 2019-09-06 DIAGNOSIS — R21 Rash and other nonspecific skin eruption: Secondary | ICD-10-CM | POA: Diagnosis not present

## 2019-09-06 DIAGNOSIS — K580 Irritable bowel syndrome with diarrhea: Secondary | ICD-10-CM | POA: Diagnosis not present

## 2019-09-06 DIAGNOSIS — R490 Dysphonia: Secondary | ICD-10-CM | POA: Diagnosis not present

## 2019-09-08 DIAGNOSIS — U071 COVID-19: Secondary | ICD-10-CM | POA: Diagnosis not present

## 2019-09-13 DIAGNOSIS — J42 Unspecified chronic bronchitis: Secondary | ICD-10-CM | POA: Diagnosis not present

## 2019-09-14 DIAGNOSIS — J370 Chronic laryngitis: Secondary | ICD-10-CM | POA: Diagnosis not present

## 2019-09-23 DIAGNOSIS — E7800 Pure hypercholesterolemia, unspecified: Secondary | ICD-10-CM | POA: Diagnosis not present

## 2019-09-23 DIAGNOSIS — E039 Hypothyroidism, unspecified: Secondary | ICD-10-CM | POA: Diagnosis not present

## 2019-09-23 DIAGNOSIS — I10 Essential (primary) hypertension: Secondary | ICD-10-CM | POA: Diagnosis not present

## 2019-09-27 DIAGNOSIS — J449 Chronic obstructive pulmonary disease, unspecified: Secondary | ICD-10-CM | POA: Diagnosis not present

## 2019-09-27 DIAGNOSIS — I5022 Chronic systolic (congestive) heart failure: Secondary | ICD-10-CM | POA: Diagnosis not present

## 2019-09-27 DIAGNOSIS — E669 Obesity, unspecified: Secondary | ICD-10-CM | POA: Diagnosis not present

## 2019-09-30 DIAGNOSIS — I110 Hypertensive heart disease with heart failure: Secondary | ICD-10-CM | POA: Diagnosis not present

## 2019-09-30 DIAGNOSIS — F329 Major depressive disorder, single episode, unspecified: Secondary | ICD-10-CM | POA: Diagnosis not present

## 2019-09-30 DIAGNOSIS — E039 Hypothyroidism, unspecified: Secondary | ICD-10-CM | POA: Diagnosis not present

## 2019-09-30 DIAGNOSIS — J449 Chronic obstructive pulmonary disease, unspecified: Secondary | ICD-10-CM | POA: Diagnosis not present

## 2019-09-30 DIAGNOSIS — E7800 Pure hypercholesterolemia, unspecified: Secondary | ICD-10-CM | POA: Diagnosis not present

## 2019-09-30 DIAGNOSIS — I5022 Chronic systolic (congestive) heart failure: Secondary | ICD-10-CM | POA: Diagnosis not present

## 2019-09-30 DIAGNOSIS — Z6832 Body mass index (BMI) 32.0-32.9, adult: Secondary | ICD-10-CM | POA: Diagnosis not present

## 2019-09-30 DIAGNOSIS — M5441 Lumbago with sciatica, right side: Secondary | ICD-10-CM | POA: Diagnosis not present

## 2019-09-30 DIAGNOSIS — K580 Irritable bowel syndrome with diarrhea: Secondary | ICD-10-CM | POA: Diagnosis not present

## 2019-09-30 DIAGNOSIS — E6609 Other obesity due to excess calories: Secondary | ICD-10-CM | POA: Diagnosis not present

## 2019-09-30 DIAGNOSIS — K219 Gastro-esophageal reflux disease without esophagitis: Secondary | ICD-10-CM | POA: Diagnosis not present

## 2019-09-30 DIAGNOSIS — J9610 Chronic respiratory failure, unspecified whether with hypoxia or hypercapnia: Secondary | ICD-10-CM | POA: Diagnosis not present

## 2019-10-03 DIAGNOSIS — C44722 Squamous cell carcinoma of skin of right lower limb, including hip: Secondary | ICD-10-CM | POA: Diagnosis not present

## 2019-10-03 DIAGNOSIS — L821 Other seborrheic keratosis: Secondary | ICD-10-CM | POA: Diagnosis not present

## 2019-10-03 DIAGNOSIS — Q809 Congenital ichthyosis, unspecified: Secondary | ICD-10-CM | POA: Diagnosis not present

## 2019-10-03 DIAGNOSIS — D0471 Carcinoma in situ of skin of right lower limb, including hip: Secondary | ICD-10-CM | POA: Diagnosis not present

## 2019-10-08 DIAGNOSIS — U071 COVID-19: Secondary | ICD-10-CM | POA: Diagnosis not present

## 2019-10-13 DIAGNOSIS — J42 Unspecified chronic bronchitis: Secondary | ICD-10-CM | POA: Diagnosis not present

## 2019-11-08 DIAGNOSIS — I214 Non-ST elevation (NSTEMI) myocardial infarction: Secondary | ICD-10-CM | POA: Diagnosis not present

## 2019-11-08 DIAGNOSIS — I10 Essential (primary) hypertension: Secondary | ICD-10-CM | POA: Diagnosis not present

## 2019-11-08 DIAGNOSIS — E7800 Pure hypercholesterolemia, unspecified: Secondary | ICD-10-CM | POA: Diagnosis not present

## 2019-11-08 DIAGNOSIS — I429 Cardiomyopathy, unspecified: Secondary | ICD-10-CM | POA: Diagnosis not present

## 2019-11-08 DIAGNOSIS — U071 COVID-19: Secondary | ICD-10-CM | POA: Diagnosis not present

## 2019-11-13 DIAGNOSIS — J42 Unspecified chronic bronchitis: Secondary | ICD-10-CM | POA: Diagnosis not present

## 2019-12-09 DIAGNOSIS — U071 COVID-19: Secondary | ICD-10-CM | POA: Diagnosis not present

## 2019-12-23 DIAGNOSIS — I10 Essential (primary) hypertension: Secondary | ICD-10-CM | POA: Diagnosis not present

## 2019-12-23 DIAGNOSIS — E7800 Pure hypercholesterolemia, unspecified: Secondary | ICD-10-CM | POA: Diagnosis not present

## 2019-12-23 DIAGNOSIS — E039 Hypothyroidism, unspecified: Secondary | ICD-10-CM | POA: Diagnosis not present

## 2019-12-26 DIAGNOSIS — H00026 Hordeolum internum left eye, unspecified eyelid: Secondary | ICD-10-CM | POA: Diagnosis not present

## 2019-12-26 DIAGNOSIS — H00023 Hordeolum internum right eye, unspecified eyelid: Secondary | ICD-10-CM | POA: Diagnosis not present

## 2019-12-30 DIAGNOSIS — M4316 Spondylolisthesis, lumbar region: Secondary | ICD-10-CM | POA: Diagnosis not present

## 2019-12-30 DIAGNOSIS — M545 Low back pain: Secondary | ICD-10-CM | POA: Diagnosis not present

## 2019-12-30 DIAGNOSIS — M47817 Spondylosis without myelopathy or radiculopathy, lumbosacral region: Secondary | ICD-10-CM | POA: Diagnosis not present

## 2019-12-30 DIAGNOSIS — M533 Sacrococcygeal disorders, not elsewhere classified: Secondary | ICD-10-CM | POA: Diagnosis not present

## 2019-12-30 DIAGNOSIS — Z23 Encounter for immunization: Secondary | ICD-10-CM | POA: Diagnosis not present

## 2019-12-30 DIAGNOSIS — M47816 Spondylosis without myelopathy or radiculopathy, lumbar region: Secondary | ICD-10-CM | POA: Diagnosis not present

## 2019-12-30 DIAGNOSIS — Z0000 Encounter for general adult medical examination without abnormal findings: Secondary | ICD-10-CM | POA: Diagnosis not present

## 2019-12-30 DIAGNOSIS — J9610 Chronic respiratory failure, unspecified whether with hypoxia or hypercapnia: Secondary | ICD-10-CM | POA: Diagnosis not present

## 2019-12-30 DIAGNOSIS — E6609 Other obesity due to excess calories: Secondary | ICD-10-CM | POA: Diagnosis not present

## 2019-12-30 DIAGNOSIS — E039 Hypothyroidism, unspecified: Secondary | ICD-10-CM | POA: Diagnosis not present

## 2019-12-30 DIAGNOSIS — I5022 Chronic systolic (congestive) heart failure: Secondary | ICD-10-CM | POA: Diagnosis not present

## 2019-12-30 DIAGNOSIS — F411 Generalized anxiety disorder: Secondary | ICD-10-CM | POA: Diagnosis not present

## 2019-12-30 DIAGNOSIS — Z6832 Body mass index (BMI) 32.0-32.9, adult: Secondary | ICD-10-CM | POA: Diagnosis not present

## 2019-12-30 DIAGNOSIS — E7800 Pure hypercholesterolemia, unspecified: Secondary | ICD-10-CM | POA: Diagnosis not present

## 2019-12-30 DIAGNOSIS — G8929 Other chronic pain: Secondary | ICD-10-CM | POA: Diagnosis not present

## 2019-12-30 DIAGNOSIS — J449 Chronic obstructive pulmonary disease, unspecified: Secondary | ICD-10-CM | POA: Diagnosis not present

## 2019-12-30 DIAGNOSIS — F329 Major depressive disorder, single episode, unspecified: Secondary | ICD-10-CM | POA: Diagnosis not present

## 2019-12-30 DIAGNOSIS — I110 Hypertensive heart disease with heart failure: Secondary | ICD-10-CM | POA: Diagnosis not present

## 2020-01-08 DIAGNOSIS — U071 COVID-19: Secondary | ICD-10-CM | POA: Diagnosis not present

## 2020-02-01 DIAGNOSIS — G8929 Other chronic pain: Secondary | ICD-10-CM | POA: Diagnosis not present

## 2020-02-01 DIAGNOSIS — M545 Low back pain, unspecified: Secondary | ICD-10-CM | POA: Diagnosis not present

## 2020-02-08 DIAGNOSIS — U071 COVID-19: Secondary | ICD-10-CM | POA: Diagnosis not present

## 2020-02-21 DIAGNOSIS — J45901 Unspecified asthma with (acute) exacerbation: Secondary | ICD-10-CM | POA: Diagnosis not present

## 2020-02-21 DIAGNOSIS — J441 Chronic obstructive pulmonary disease with (acute) exacerbation: Secondary | ICD-10-CM | POA: Diagnosis not present

## 2020-02-21 DIAGNOSIS — G4733 Obstructive sleep apnea (adult) (pediatric): Secondary | ICD-10-CM | POA: Diagnosis not present

## 2020-02-21 DIAGNOSIS — E669 Obesity, unspecified: Secondary | ICD-10-CM | POA: Diagnosis not present

## 2020-03-09 DIAGNOSIS — U071 COVID-19: Secondary | ICD-10-CM | POA: Diagnosis not present

## 2020-03-28 DIAGNOSIS — E039 Hypothyroidism, unspecified: Secondary | ICD-10-CM | POA: Diagnosis not present

## 2020-03-28 DIAGNOSIS — I10 Essential (primary) hypertension: Secondary | ICD-10-CM | POA: Diagnosis not present

## 2020-03-28 DIAGNOSIS — F1190 Opioid use, unspecified, uncomplicated: Secondary | ICD-10-CM | POA: Diagnosis not present

## 2020-03-28 DIAGNOSIS — E7800 Pure hypercholesterolemia, unspecified: Secondary | ICD-10-CM | POA: Diagnosis not present

## 2020-04-04 DIAGNOSIS — E7800 Pure hypercholesterolemia, unspecified: Secondary | ICD-10-CM | POA: Diagnosis not present

## 2020-04-04 DIAGNOSIS — G2581 Restless legs syndrome: Secondary | ICD-10-CM | POA: Diagnosis not present

## 2020-04-04 DIAGNOSIS — J441 Chronic obstructive pulmonary disease with (acute) exacerbation: Secondary | ICD-10-CM | POA: Diagnosis not present

## 2020-04-04 DIAGNOSIS — I5022 Chronic systolic (congestive) heart failure: Secondary | ICD-10-CM | POA: Diagnosis not present

## 2020-04-04 DIAGNOSIS — Z0000 Encounter for general adult medical examination without abnormal findings: Secondary | ICD-10-CM | POA: Diagnosis not present

## 2020-04-04 DIAGNOSIS — J302 Other seasonal allergic rhinitis: Secondary | ICD-10-CM | POA: Diagnosis not present

## 2020-04-04 DIAGNOSIS — J9610 Chronic respiratory failure, unspecified whether with hypoxia or hypercapnia: Secondary | ICD-10-CM | POA: Diagnosis not present

## 2020-04-04 DIAGNOSIS — I110 Hypertensive heart disease with heart failure: Secondary | ICD-10-CM | POA: Diagnosis not present

## 2020-04-04 DIAGNOSIS — K219 Gastro-esophageal reflux disease without esophagitis: Secondary | ICD-10-CM | POA: Diagnosis not present

## 2020-04-04 DIAGNOSIS — I7300 Raynaud's syndrome without gangrene: Secondary | ICD-10-CM | POA: Diagnosis not present

## 2020-04-04 DIAGNOSIS — E039 Hypothyroidism, unspecified: Secondary | ICD-10-CM | POA: Diagnosis not present

## 2020-04-04 DIAGNOSIS — M545 Low back pain, unspecified: Secondary | ICD-10-CM | POA: Diagnosis not present

## 2020-04-09 DIAGNOSIS — U071 COVID-19: Secondary | ICD-10-CM | POA: Diagnosis not present

## 2020-04-10 DIAGNOSIS — I10 Essential (primary) hypertension: Secondary | ICD-10-CM | POA: Diagnosis not present

## 2020-04-10 DIAGNOSIS — R918 Other nonspecific abnormal finding of lung field: Secondary | ICD-10-CM | POA: Diagnosis not present

## 2020-04-10 DIAGNOSIS — I252 Old myocardial infarction: Secondary | ICD-10-CM | POA: Diagnosis not present

## 2020-04-10 DIAGNOSIS — E7800 Pure hypercholesterolemia, unspecified: Secondary | ICD-10-CM | POA: Diagnosis not present

## 2020-04-10 DIAGNOSIS — I429 Cardiomyopathy, unspecified: Secondary | ICD-10-CM | POA: Diagnosis not present

## 2020-04-10 DIAGNOSIS — I214 Non-ST elevation (NSTEMI) myocardial infarction: Secondary | ICD-10-CM | POA: Diagnosis not present

## 2020-04-10 DIAGNOSIS — Z01818 Encounter for other preprocedural examination: Secondary | ICD-10-CM | POA: Diagnosis not present

## 2020-04-10 DIAGNOSIS — R0602 Shortness of breath: Secondary | ICD-10-CM | POA: Diagnosis not present

## 2020-04-17 DIAGNOSIS — I429 Cardiomyopathy, unspecified: Secondary | ICD-10-CM | POA: Diagnosis not present

## 2020-04-17 DIAGNOSIS — I428 Other cardiomyopathies: Secondary | ICD-10-CM | POA: Diagnosis not present

## 2020-04-17 DIAGNOSIS — E7800 Pure hypercholesterolemia, unspecified: Secondary | ICD-10-CM | POA: Diagnosis not present

## 2020-04-17 DIAGNOSIS — Z79899 Other long term (current) drug therapy: Secondary | ICD-10-CM | POA: Diagnosis not present

## 2020-04-17 DIAGNOSIS — I10 Essential (primary) hypertension: Secondary | ICD-10-CM | POA: Diagnosis not present

## 2020-04-17 DIAGNOSIS — Z8616 Personal history of COVID-19: Secondary | ICD-10-CM | POA: Diagnosis not present

## 2020-04-17 DIAGNOSIS — R0602 Shortness of breath: Secondary | ICD-10-CM | POA: Diagnosis not present

## 2020-04-17 DIAGNOSIS — I252 Old myocardial infarction: Secondary | ICD-10-CM | POA: Diagnosis not present

## 2020-05-07 DIAGNOSIS — J449 Chronic obstructive pulmonary disease, unspecified: Secondary | ICD-10-CM | POA: Diagnosis not present

## 2020-05-07 DIAGNOSIS — E669 Obesity, unspecified: Secondary | ICD-10-CM | POA: Diagnosis not present

## 2020-05-10 DIAGNOSIS — U071 COVID-19: Secondary | ICD-10-CM | POA: Diagnosis not present

## 2020-05-15 DIAGNOSIS — J441 Chronic obstructive pulmonary disease with (acute) exacerbation: Secondary | ICD-10-CM | POA: Diagnosis not present

## 2020-05-15 DIAGNOSIS — G4733 Obstructive sleep apnea (adult) (pediatric): Secondary | ICD-10-CM | POA: Diagnosis not present

## 2020-05-15 DIAGNOSIS — I110 Hypertensive heart disease with heart failure: Secondary | ICD-10-CM | POA: Diagnosis not present

## 2020-05-15 DIAGNOSIS — I5022 Chronic systolic (congestive) heart failure: Secondary | ICD-10-CM | POA: Diagnosis not present

## 2020-06-07 DIAGNOSIS — U071 COVID-19: Secondary | ICD-10-CM | POA: Diagnosis not present

## 2020-06-15 DIAGNOSIS — J8410 Pulmonary fibrosis, unspecified: Secondary | ICD-10-CM | POA: Diagnosis not present

## 2020-06-15 DIAGNOSIS — J984 Other disorders of lung: Secondary | ICD-10-CM | POA: Diagnosis not present

## 2020-06-15 DIAGNOSIS — K753 Granulomatous hepatitis, not elsewhere classified: Secondary | ICD-10-CM | POA: Diagnosis not present

## 2020-06-15 DIAGNOSIS — R918 Other nonspecific abnormal finding of lung field: Secondary | ICD-10-CM | POA: Diagnosis not present

## 2020-06-15 DIAGNOSIS — I2510 Atherosclerotic heart disease of native coronary artery without angina pectoris: Secondary | ICD-10-CM | POA: Diagnosis not present

## 2020-06-30 DIAGNOSIS — R202 Paresthesia of skin: Secondary | ICD-10-CM | POA: Diagnosis not present

## 2020-06-30 DIAGNOSIS — M79606 Pain in leg, unspecified: Secondary | ICD-10-CM | POA: Diagnosis not present

## 2020-07-08 DIAGNOSIS — U071 COVID-19: Secondary | ICD-10-CM | POA: Diagnosis not present

## 2020-07-10 DIAGNOSIS — Z1231 Encounter for screening mammogram for malignant neoplasm of breast: Secondary | ICD-10-CM | POA: Diagnosis not present

## 2020-07-21 DIAGNOSIS — R5383 Other fatigue: Secondary | ICD-10-CM | POA: Diagnosis not present

## 2020-07-21 DIAGNOSIS — E7800 Pure hypercholesterolemia, unspecified: Secondary | ICD-10-CM | POA: Diagnosis not present

## 2020-07-21 DIAGNOSIS — Z79899 Other long term (current) drug therapy: Secondary | ICD-10-CM | POA: Diagnosis not present

## 2020-07-21 DIAGNOSIS — E559 Vitamin D deficiency, unspecified: Secondary | ICD-10-CM | POA: Diagnosis not present

## 2020-07-21 DIAGNOSIS — Z1159 Encounter for screening for other viral diseases: Secondary | ICD-10-CM | POA: Diagnosis not present

## 2020-07-21 DIAGNOSIS — E538 Deficiency of other specified B group vitamins: Secondary | ICD-10-CM | POA: Diagnosis not present

## 2020-07-21 DIAGNOSIS — M129 Arthropathy, unspecified: Secondary | ICD-10-CM | POA: Diagnosis not present

## 2020-07-31 DIAGNOSIS — M79606 Pain in leg, unspecified: Secondary | ICD-10-CM | POA: Diagnosis not present

## 2020-07-31 DIAGNOSIS — R600 Localized edema: Secondary | ICD-10-CM | POA: Diagnosis not present

## 2020-08-07 DIAGNOSIS — U071 COVID-19: Secondary | ICD-10-CM | POA: Diagnosis not present

## 2020-08-09 DIAGNOSIS — Z79899 Other long term (current) drug therapy: Secondary | ICD-10-CM | POA: Diagnosis not present

## 2020-08-09 DIAGNOSIS — R600 Localized edema: Secondary | ICD-10-CM | POA: Diagnosis not present

## 2020-08-09 DIAGNOSIS — E790 Hyperuricemia without signs of inflammatory arthritis and tophaceous disease: Secondary | ICD-10-CM | POA: Diagnosis not present

## 2020-09-04 DIAGNOSIS — R918 Other nonspecific abnormal finding of lung field: Secondary | ICD-10-CM | POA: Diagnosis not present

## 2020-09-04 DIAGNOSIS — E669 Obesity, unspecified: Secondary | ICD-10-CM | POA: Diagnosis not present

## 2020-09-04 DIAGNOSIS — J449 Chronic obstructive pulmonary disease, unspecified: Secondary | ICD-10-CM | POA: Diagnosis not present

## 2020-09-07 DIAGNOSIS — U071 COVID-19: Secondary | ICD-10-CM | POA: Diagnosis not present

## 2020-09-07 DIAGNOSIS — M545 Low back pain, unspecified: Secondary | ICD-10-CM | POA: Diagnosis not present

## 2020-09-07 DIAGNOSIS — M25559 Pain in unspecified hip: Secondary | ICD-10-CM | POA: Diagnosis not present

## 2020-09-07 DIAGNOSIS — Z79899 Other long term (current) drug therapy: Secondary | ICD-10-CM | POA: Diagnosis not present

## 2020-09-07 DIAGNOSIS — M79606 Pain in leg, unspecified: Secondary | ICD-10-CM | POA: Diagnosis not present

## 2020-10-07 DIAGNOSIS — U071 COVID-19: Secondary | ICD-10-CM | POA: Diagnosis not present

## 2020-10-09 DIAGNOSIS — G2581 Restless legs syndrome: Secondary | ICD-10-CM | POA: Diagnosis not present

## 2020-10-09 DIAGNOSIS — M545 Low back pain, unspecified: Secondary | ICD-10-CM | POA: Diagnosis not present

## 2020-10-09 DIAGNOSIS — G4700 Insomnia, unspecified: Secondary | ICD-10-CM | POA: Diagnosis not present

## 2020-10-09 DIAGNOSIS — T63301A Toxic effect of unspecified spider venom, accidental (unintentional), initial encounter: Secondary | ICD-10-CM | POA: Diagnosis not present

## 2020-10-16 DIAGNOSIS — Z79899 Other long term (current) drug therapy: Secondary | ICD-10-CM | POA: Diagnosis not present

## 2020-10-16 DIAGNOSIS — M545 Low back pain, unspecified: Secondary | ICD-10-CM | POA: Diagnosis not present

## 2020-10-16 DIAGNOSIS — M79606 Pain in leg, unspecified: Secondary | ICD-10-CM | POA: Diagnosis not present

## 2020-10-16 DIAGNOSIS — Z6832 Body mass index (BMI) 32.0-32.9, adult: Secondary | ICD-10-CM | POA: Diagnosis not present

## 2020-11-06 DIAGNOSIS — M545 Low back pain, unspecified: Secondary | ICD-10-CM | POA: Diagnosis not present

## 2020-11-06 DIAGNOSIS — R159 Full incontinence of feces: Secondary | ICD-10-CM | POA: Diagnosis not present

## 2020-11-06 DIAGNOSIS — M79606 Pain in leg, unspecified: Secondary | ICD-10-CM | POA: Diagnosis not present

## 2020-11-06 DIAGNOSIS — Z79899 Other long term (current) drug therapy: Secondary | ICD-10-CM | POA: Diagnosis not present

## 2020-11-07 DIAGNOSIS — U071 COVID-19: Secondary | ICD-10-CM | POA: Diagnosis not present

## 2020-12-07 DIAGNOSIS — E538 Deficiency of other specified B group vitamins: Secondary | ICD-10-CM | POA: Diagnosis not present

## 2020-12-07 DIAGNOSIS — Z79899 Other long term (current) drug therapy: Secondary | ICD-10-CM | POA: Diagnosis not present

## 2020-12-07 DIAGNOSIS — M79606 Pain in leg, unspecified: Secondary | ICD-10-CM | POA: Diagnosis not present

## 2020-12-07 DIAGNOSIS — M545 Low back pain, unspecified: Secondary | ICD-10-CM | POA: Diagnosis not present

## 2020-12-07 DIAGNOSIS — Z1159 Encounter for screening for other viral diseases: Secondary | ICD-10-CM | POA: Diagnosis not present

## 2020-12-07 DIAGNOSIS — E559 Vitamin D deficiency, unspecified: Secondary | ICD-10-CM | POA: Diagnosis not present

## 2020-12-07 DIAGNOSIS — R5383 Other fatigue: Secondary | ICD-10-CM | POA: Diagnosis not present

## 2020-12-07 DIAGNOSIS — E7800 Pure hypercholesterolemia, unspecified: Secondary | ICD-10-CM | POA: Diagnosis not present

## 2020-12-08 DIAGNOSIS — U071 COVID-19: Secondary | ICD-10-CM | POA: Diagnosis not present

## 2020-12-12 DIAGNOSIS — E882 Lipomatosis, not elsewhere classified: Secondary | ICD-10-CM | POA: Diagnosis not present

## 2020-12-12 DIAGNOSIS — M545 Low back pain, unspecified: Secondary | ICD-10-CM | POA: Diagnosis not present

## 2020-12-12 DIAGNOSIS — M47816 Spondylosis without myelopathy or radiculopathy, lumbar region: Secondary | ICD-10-CM | POA: Diagnosis not present

## 2020-12-12 DIAGNOSIS — M4316 Spondylolisthesis, lumbar region: Secondary | ICD-10-CM | POA: Diagnosis not present

## 2020-12-12 DIAGNOSIS — M48061 Spinal stenosis, lumbar region without neurogenic claudication: Secondary | ICD-10-CM | POA: Diagnosis not present

## 2020-12-19 DIAGNOSIS — E7800 Pure hypercholesterolemia, unspecified: Secondary | ICD-10-CM | POA: Diagnosis not present

## 2020-12-19 DIAGNOSIS — I110 Hypertensive heart disease with heart failure: Secondary | ICD-10-CM | POA: Diagnosis not present

## 2020-12-19 DIAGNOSIS — I2510 Atherosclerotic heart disease of native coronary artery without angina pectoris: Secondary | ICD-10-CM | POA: Diagnosis not present

## 2020-12-19 DIAGNOSIS — I5022 Chronic systolic (congestive) heart failure: Secondary | ICD-10-CM | POA: Diagnosis not present

## 2020-12-19 DIAGNOSIS — R0602 Shortness of breath: Secondary | ICD-10-CM | POA: Diagnosis not present

## 2021-01-04 IMAGING — DX DG CHEST 2V
2 series · 2 of 2 positions shown · non-contrast
Comparison: Chest x-ray 12/18/2018.

CLINICAL DATA: 78-year-old female with history of nonproductive
cough and wheezing.

EXAM:
CHEST - 2 VIEW

[chest pa]
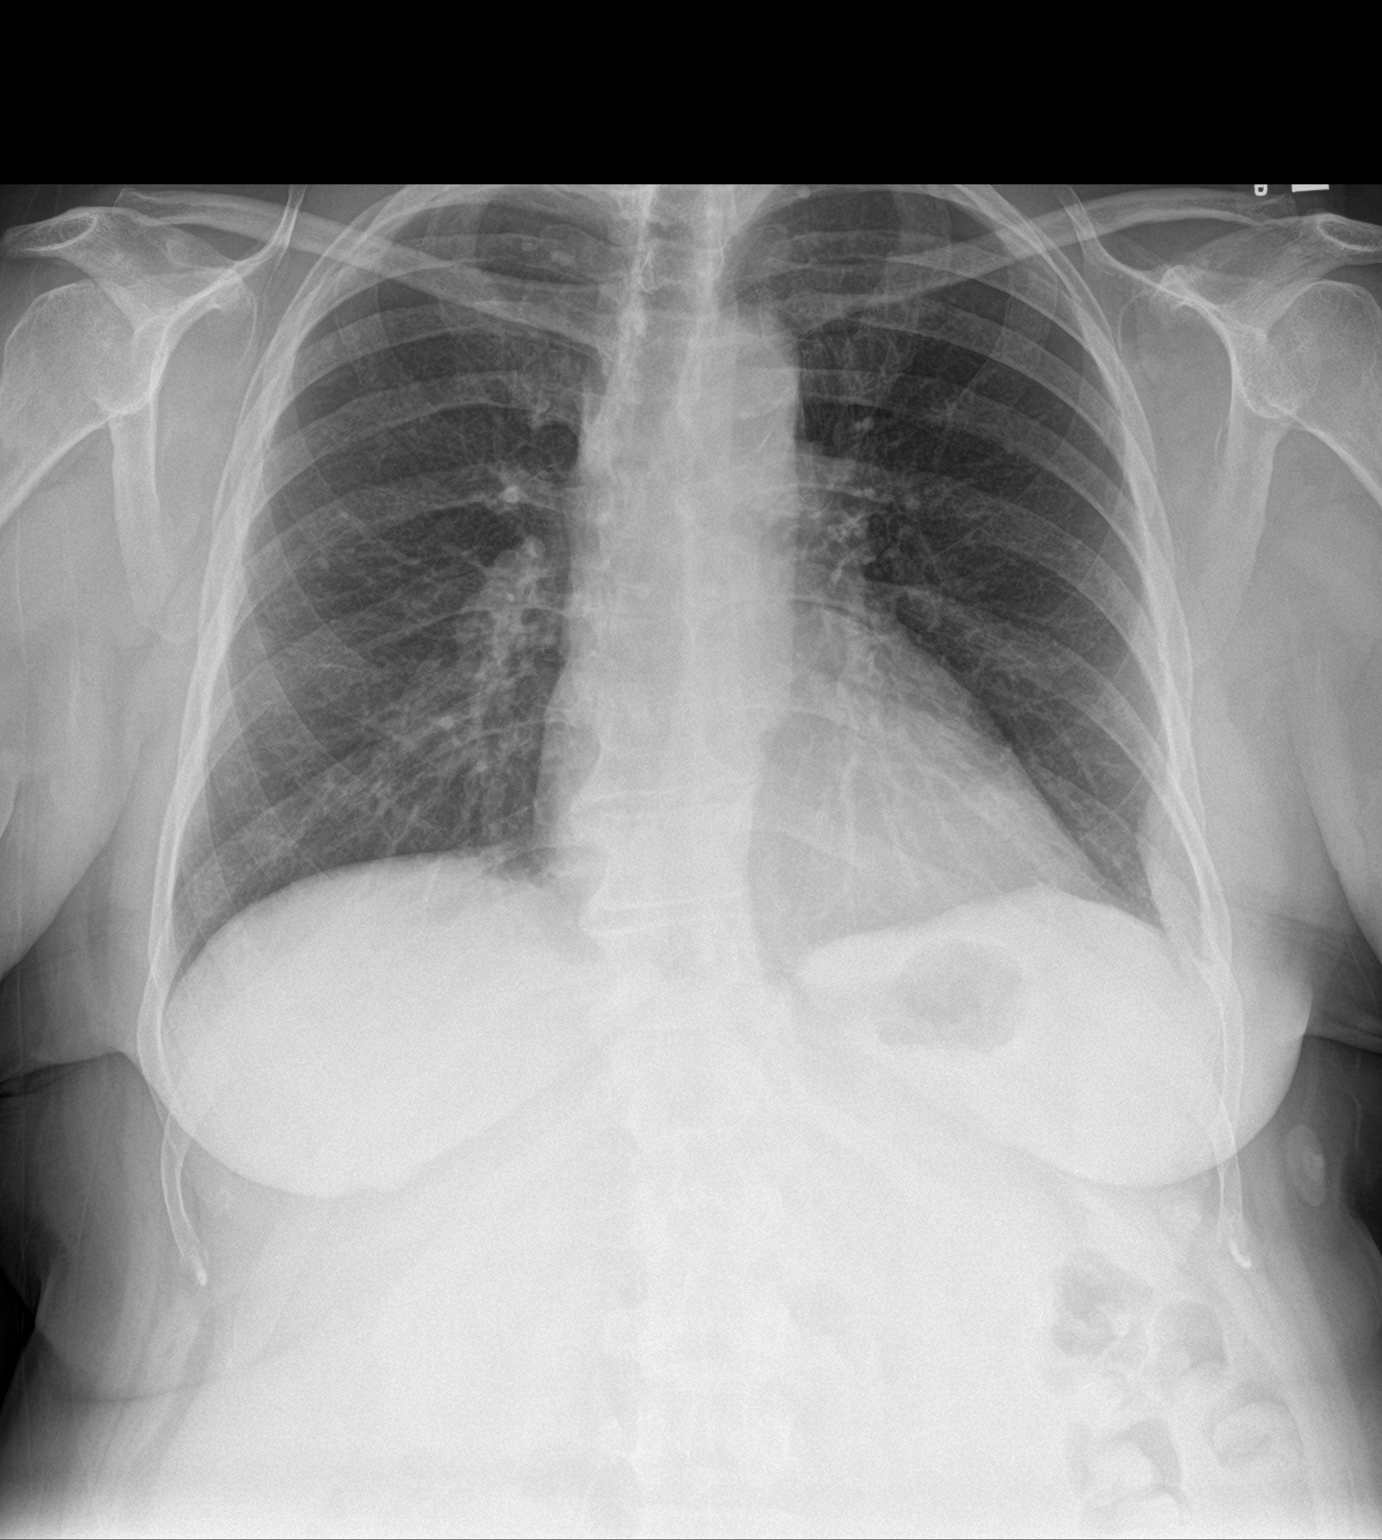

[chest lat]
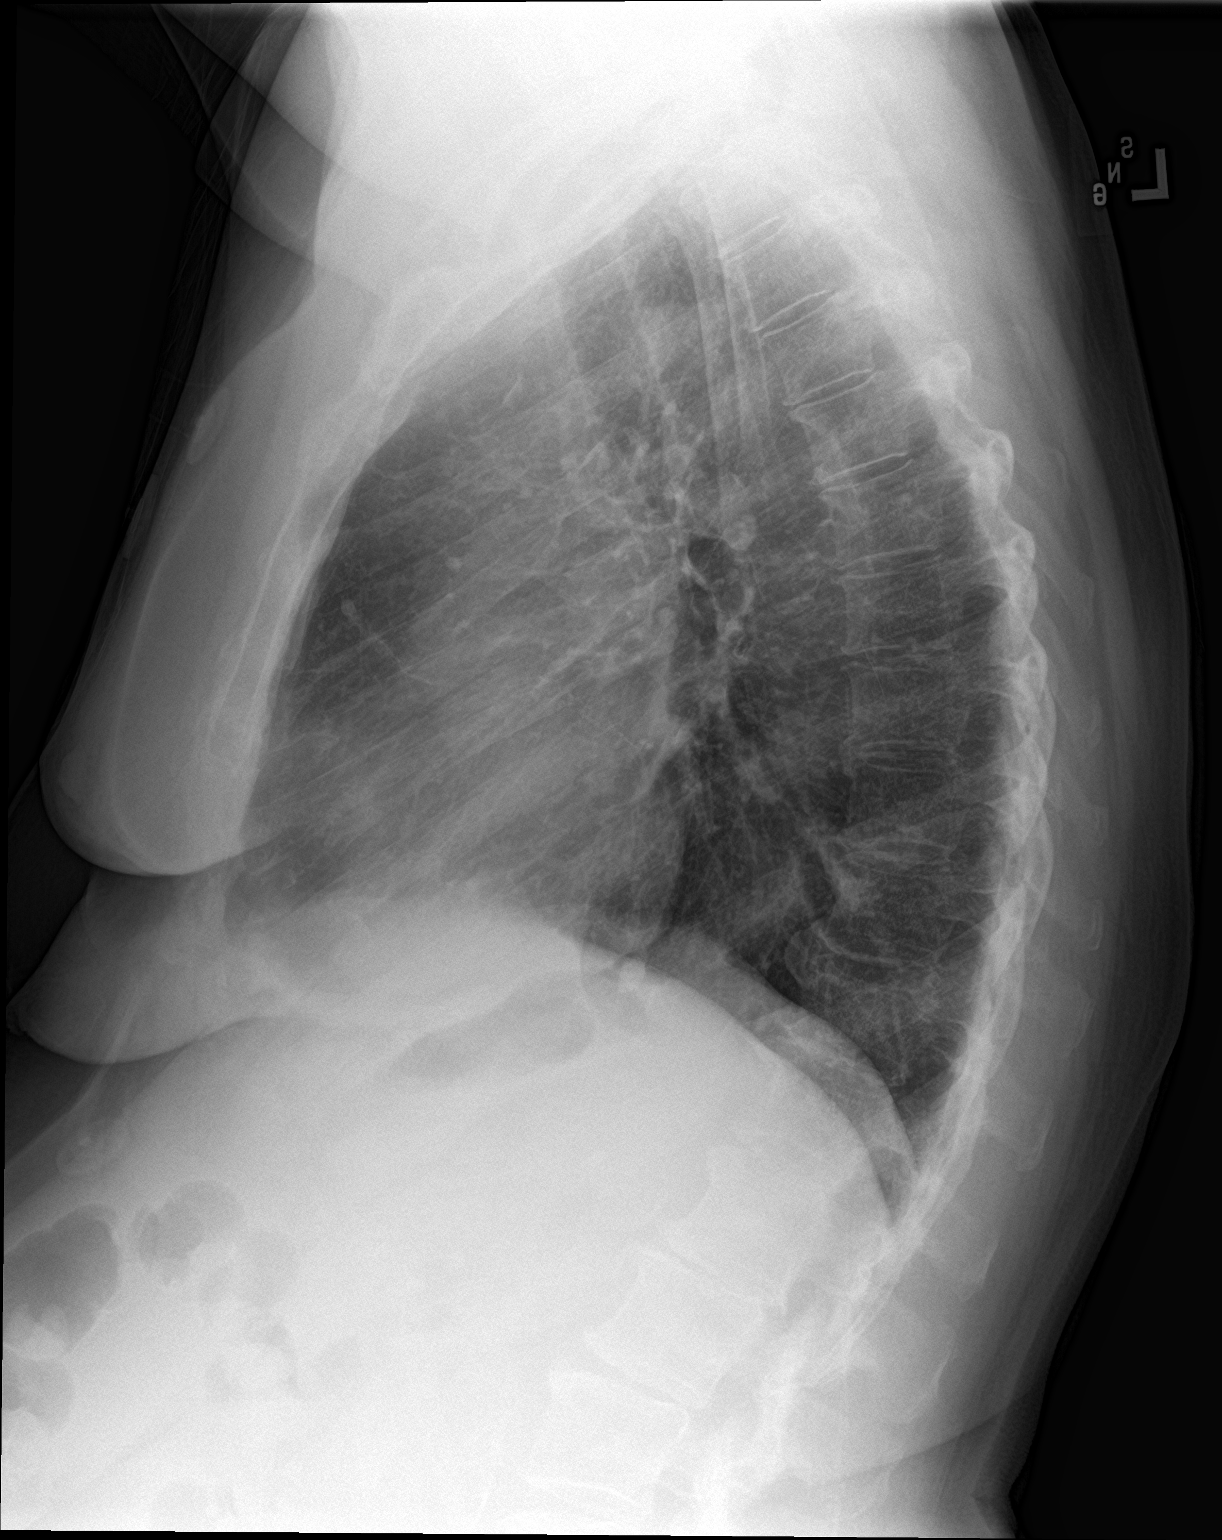

[2 of 2 positions shown; findings below may reference images not displayed]

FINDINGS: Multiple tiny subcentimeter pulmonary nodules scattered throughout
the lungs bilaterally appear very dense, presumably calcified
granulomas. Lung volumes are normal. No consolidative airspace
disease. No pleural effusions. No pneumothorax. No suspicious
appearing pulmonary nodule or mass noted. Pulmonary vasculature and
the cardiomediastinal silhouette are within normal limits.
Atherosclerotic calcifications in the thoracic aorta.
IMPRESSION: 1.  No radiographic evidence of acute cardiopulmonary disease.
2. Aortic atherosclerosis.

## 2021-01-06 IMAGING — CT CT ANGIO CHEST
2 of 6 series · 18 of 46 positions shown · IV contrast (ISOVUE 370)
Comparison: Chest x-ray January 08, 2019. Chest CT December 10, 2018.

CLINICAL DATA: Cough and shortness of breath.  Leg swelling.

EXAM:
CT ANGIOGRAPHY CHEST WITH CONTRAST
TECHNIQUE: Multidetector CT imaging of the chest was performed using the
standard protocol during bolus administration of intravenous
contrast. Multiplanar CT image reconstructions and MIPs were
obtained to evaluate the vascular anatomy.
CONTRAST:  100mL OMNIPAQUE IOHEXOL 350 MG/ML SOLN

[Series 5: thins · axial · 0.63mm/px · z∈[+670,+908]mm · 16 of 262 slices shown]
[im 12/262  lung]
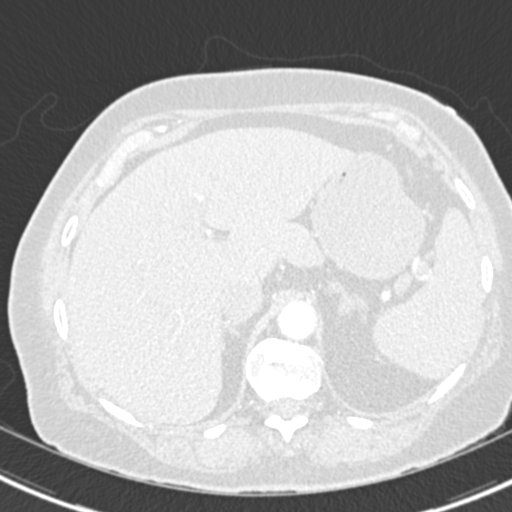
[im 35/262  soft-tissue]
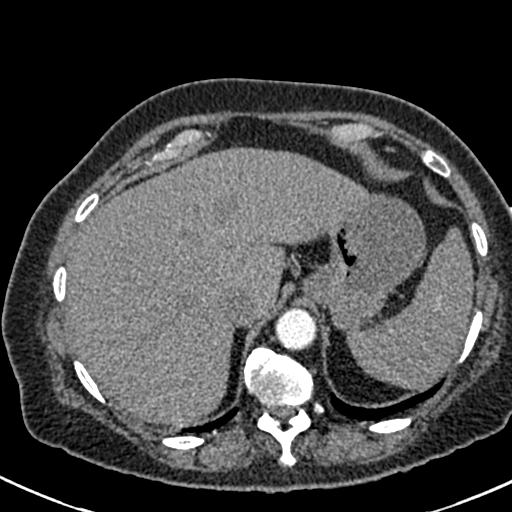
[im 46/262  lung]
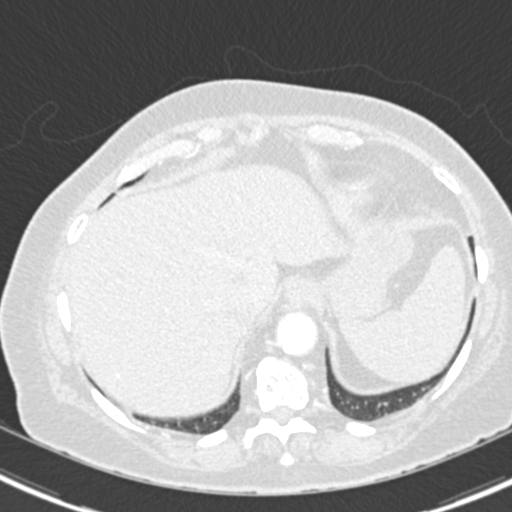
[im 57/262  soft-tissue]
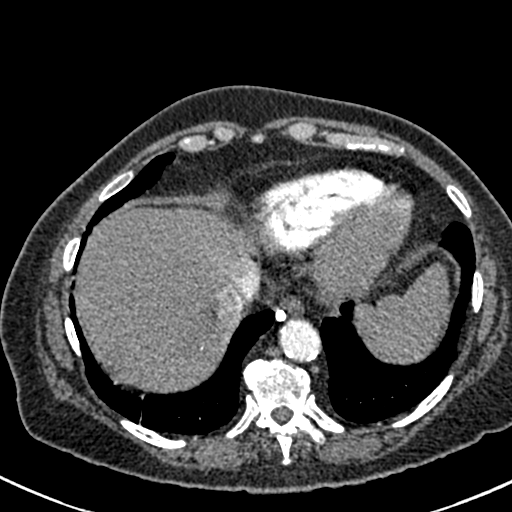
[im 80/262  lung]
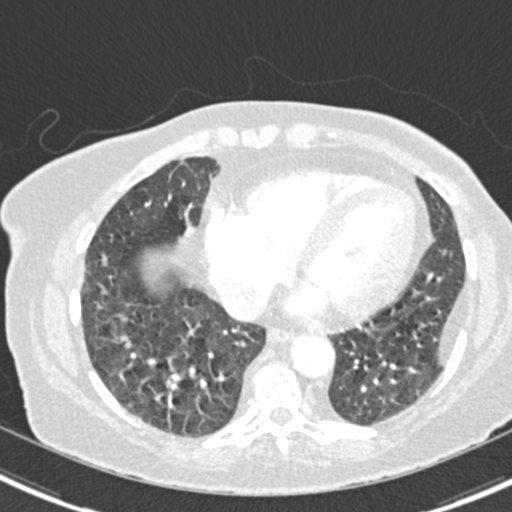
[im 91/262  soft-tissue]
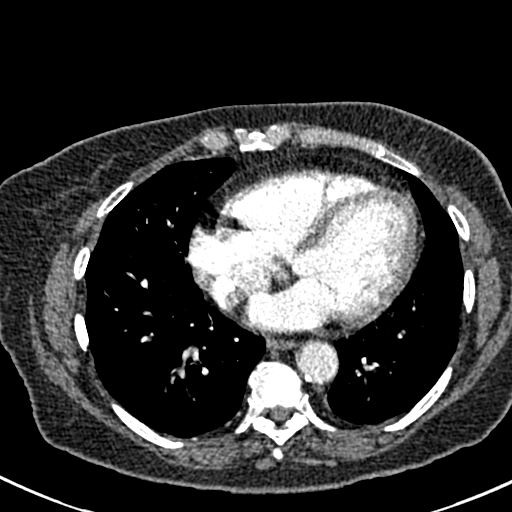
[im 103/262  lung]
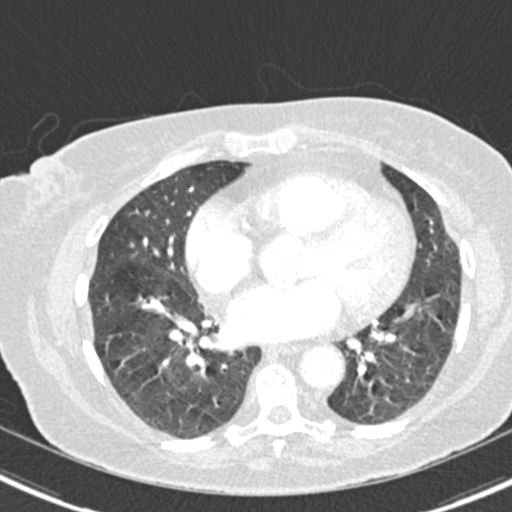
[im 125/262  soft-tissue]
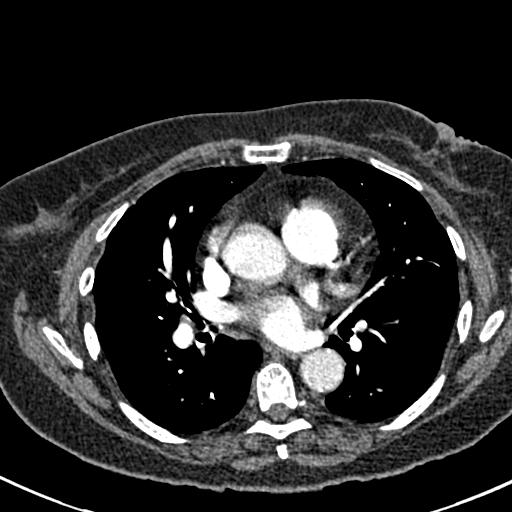
[im 137/262  lung]
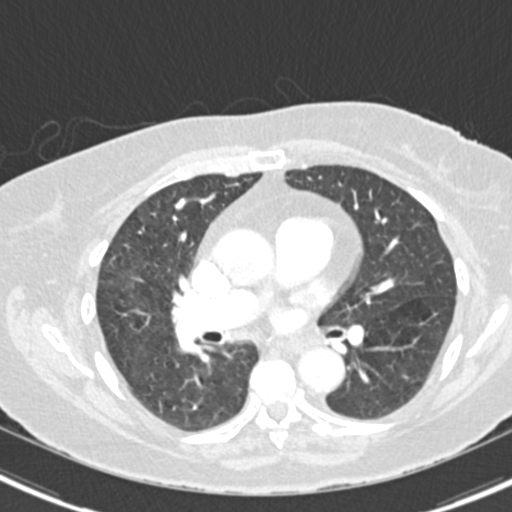
[im 159/262  soft-tissue]
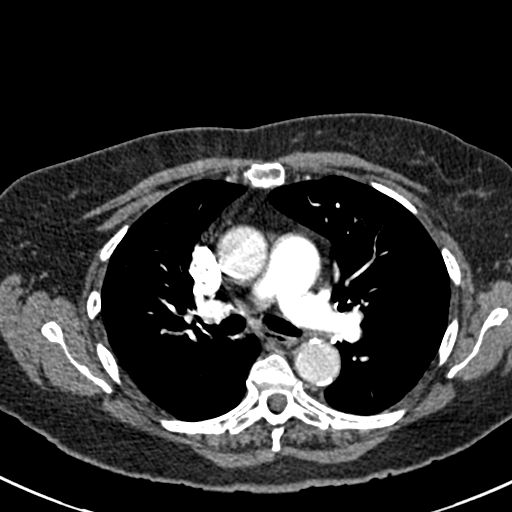
[im 171/262  lung]
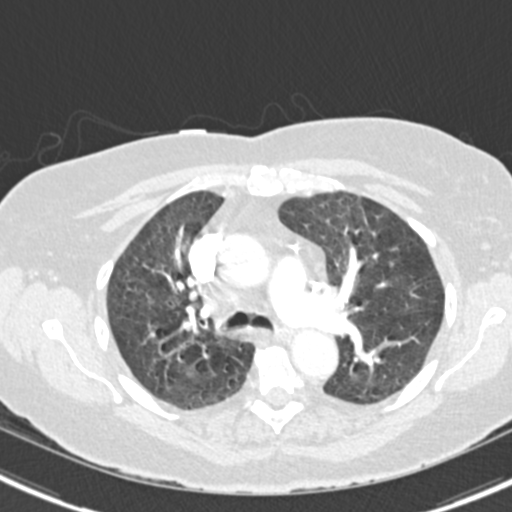
[im 182/262  soft-tissue]
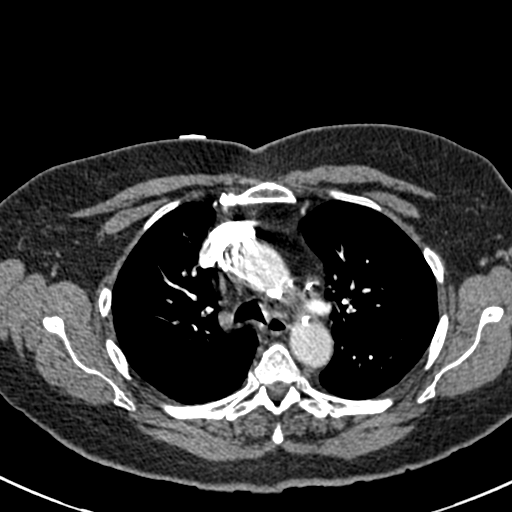
[im 205/262  lung]
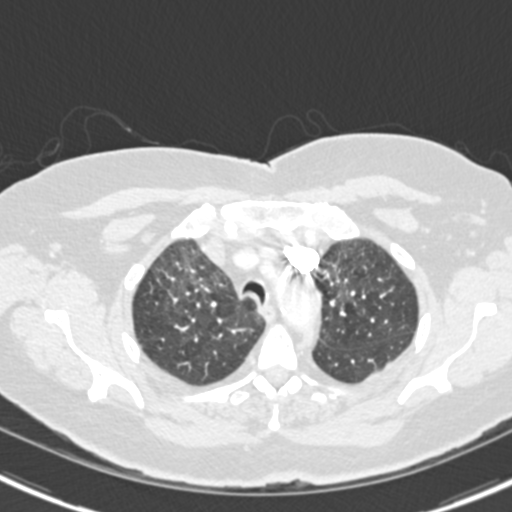
[im 216/262  soft-tissue]
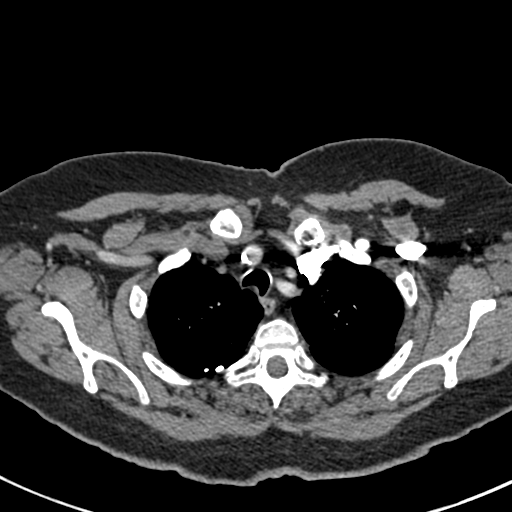
[im 227/262  lung]
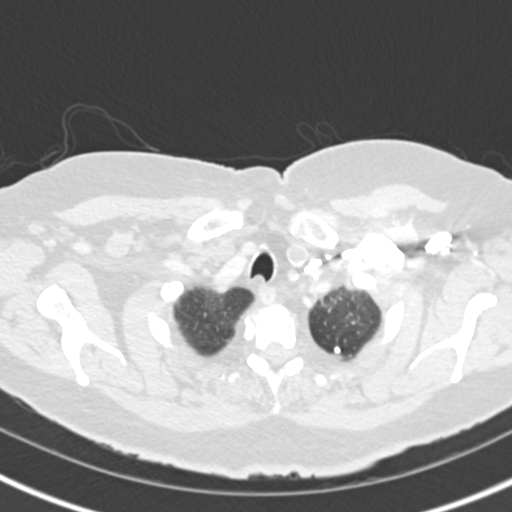
[im 250/262  soft-tissue]
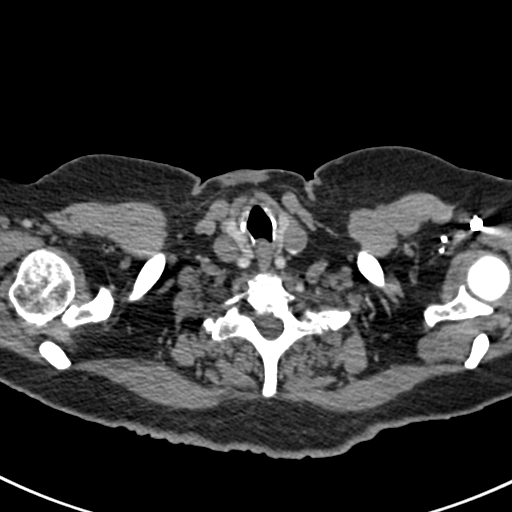

[Series 7: coronal mpr · coronal · 0.52mm/px · 2 of 79 slices shown]
[im 27/79  soft-tissue]
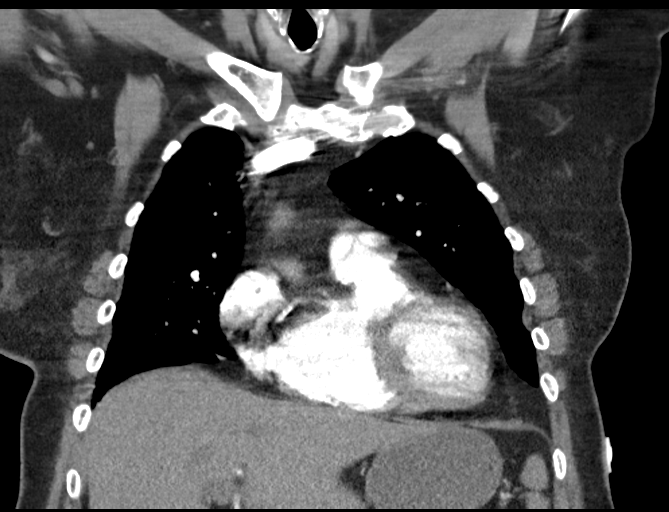
[im 53/79  soft-tissue]
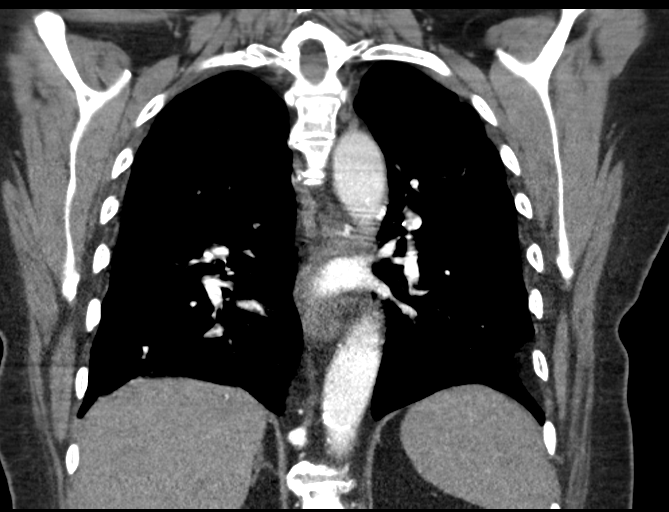

[18 of 46 positions shown; findings below may reference images not displayed]

FINDINGS: Cardiovascular: The heart size remains borderline. Coronary artery
calcifications are noted. Atherosclerotic changes are seen in the
nonaneurysmal aorta, relatively mild. No dissection or aneurysm.
Evaluation for pulmonary emboli is somewhat limited due to
respiratory motion. Taking the resulting stairstep artifact into
account, no pulmonary emboli are identified.

Mediastinum/Nodes: There is fat in the left pleural space such as on
axial image 97, of no significance. The anterior chest wall is
normal. Thyroid is unremarkable. Calcified nodes are scattered
throughout the mediastinum with no suspicious adenopathy. No
effusions. The esophagus is unremarkable.

Lungs/Pleura: Central airways are normal. No pneumothorax.
Evaluation of the lung parenchyma is limited due to respiratory
motion. There is some scarring in the apices, left greater than
right. A few scattered calcified granulomas are identified. Several
other nodules are seen in the lungs, completely described on the
[DATE] CT scan of the chest. A representative nodule
is seen on series 6, image 75 measuring 6 mm on both studies. No new
nodules. No focal infiltrates are identified taking respiratory
motion into account. No masses.

Upper Abdomen: Stable nodularity in the left adrenal gland, better
assessed on the previous study. No acute abnormalities in the upper
abdomen.

Musculoskeletal: No chest wall abnormality. No acute or significant
osseous findings.

Review of the MIP images confirms the above findings.
IMPRESSION: 1. Evaluation for pulmonary emboli is somewhat limited due to
respiratory motion and stairstep artifact. No emboli seen.
2. Stable nodularity in the lungs. A 3 to six-month follow-up CT
scan was recommended on the December 10, 2018 study report.
3. Stable nodularity in the left adrenal gland.
4. Mild atherosclerotic change in the thoracic aorta. Coronary
artery calcifications.

Aortic Atherosclerosis ([id number]).

## 2021-01-07 DIAGNOSIS — J449 Chronic obstructive pulmonary disease, unspecified: Secondary | ICD-10-CM | POA: Diagnosis not present

## 2021-01-07 DIAGNOSIS — E669 Obesity, unspecified: Secondary | ICD-10-CM | POA: Diagnosis not present

## 2021-01-07 DIAGNOSIS — U071 COVID-19: Secondary | ICD-10-CM | POA: Diagnosis not present

## 2021-01-08 DIAGNOSIS — Z780 Asymptomatic menopausal state: Secondary | ICD-10-CM | POA: Diagnosis not present

## 2021-01-08 DIAGNOSIS — M545 Low back pain, unspecified: Secondary | ICD-10-CM | POA: Diagnosis not present

## 2021-01-08 DIAGNOSIS — M79606 Pain in leg, unspecified: Secondary | ICD-10-CM | POA: Diagnosis not present

## 2021-01-08 DIAGNOSIS — Z79899 Other long term (current) drug therapy: Secondary | ICD-10-CM | POA: Diagnosis not present

## 2021-01-30 IMAGING — DX DG CHEST 1V PORT
1 series · 1 of 1 positions shown · non-contrast
Comparison: Radiograph 02/02/2019, CT 01/10/2019

CLINICAL DATA: Shortness of breath and swelling

EXAM:
PORTABLE CHEST 1 VIEW

[chest ap]
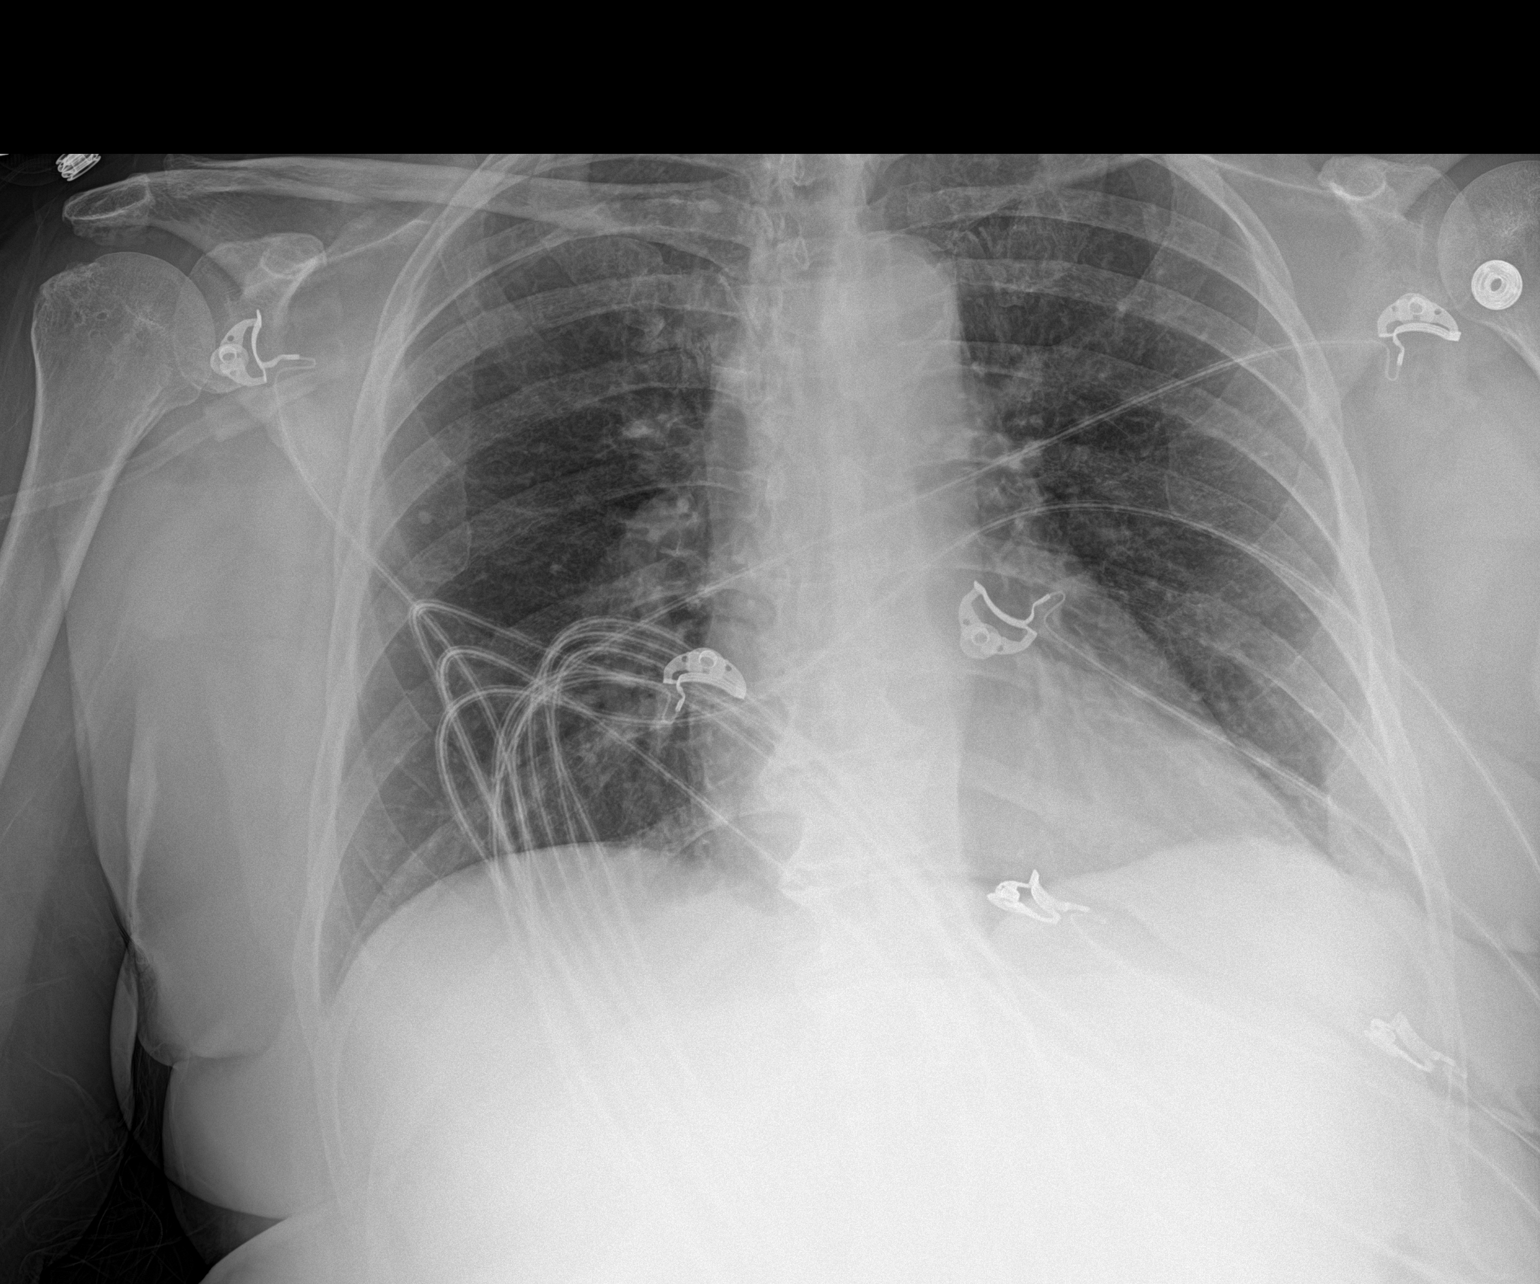

[1 of 1 positions shown; findings below may reference images not displayed]

FINDINGS: Low volumes and atelectasis. No confluent opacity or convincing
features of edema. No pneumothorax or effusion. Stable pleural
thickening in the left lung base corresponding to a subpleural fat
pad seen on comparison CT. Few calcified granulomata. The aorta is
calcified. The remaining cardiomediastinal contours are
unremarkable.
IMPRESSION: Low lung volumes and atelectasis.

No acute cardiopulmonary abnormality.

Aortic Atherosclerosis ([id number]).

## 2021-02-07 DIAGNOSIS — U071 COVID-19: Secondary | ICD-10-CM | POA: Diagnosis not present

## 2021-02-08 DIAGNOSIS — M545 Low back pain, unspecified: Secondary | ICD-10-CM | POA: Diagnosis not present

## 2021-02-08 DIAGNOSIS — Z6834 Body mass index (BMI) 34.0-34.9, adult: Secondary | ICD-10-CM | POA: Diagnosis not present

## 2021-02-08 DIAGNOSIS — M79606 Pain in leg, unspecified: Secondary | ICD-10-CM | POA: Diagnosis not present

## 2021-02-08 DIAGNOSIS — Z79899 Other long term (current) drug therapy: Secondary | ICD-10-CM | POA: Diagnosis not present

## 2021-02-22 DIAGNOSIS — R600 Localized edema: Secondary | ICD-10-CM | POA: Diagnosis not present

## 2021-02-22 DIAGNOSIS — R053 Chronic cough: Secondary | ICD-10-CM | POA: Diagnosis not present

## 2021-02-22 DIAGNOSIS — J449 Chronic obstructive pulmonary disease, unspecified: Secondary | ICD-10-CM | POA: Diagnosis not present

## 2021-02-22 DIAGNOSIS — E669 Obesity, unspecified: Secondary | ICD-10-CM | POA: Diagnosis not present

## 2021-03-08 DIAGNOSIS — E538 Deficiency of other specified B group vitamins: Secondary | ICD-10-CM | POA: Diagnosis not present

## 2021-03-08 DIAGNOSIS — M545 Low back pain, unspecified: Secondary | ICD-10-CM | POA: Diagnosis not present

## 2021-03-08 DIAGNOSIS — E559 Vitamin D deficiency, unspecified: Secondary | ICD-10-CM | POA: Diagnosis not present

## 2021-03-08 DIAGNOSIS — E7800 Pure hypercholesterolemia, unspecified: Secondary | ICD-10-CM | POA: Diagnosis not present

## 2021-03-08 DIAGNOSIS — Z79899 Other long term (current) drug therapy: Secondary | ICD-10-CM | POA: Diagnosis not present

## 2021-03-08 DIAGNOSIS — R5383 Other fatigue: Secondary | ICD-10-CM | POA: Diagnosis not present

## 2021-03-09 DIAGNOSIS — U071 COVID-19: Secondary | ICD-10-CM | POA: Diagnosis not present
